# Patient Record
Sex: Male | Born: 1985 | Race: Black or African American | Hispanic: No | Marital: Married | Smoking: Never smoker
Health system: Southern US, Community
[De-identification: ages and names within clinical notes are randomized; demographics above are authoritative.]

## PROBLEM LIST (undated history)

## (undated) DIAGNOSIS — T7840XA Allergy, unspecified, initial encounter: Secondary | ICD-10-CM

## (undated) HISTORY — DX: Allergy, unspecified, initial encounter: T78.40XA

---
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
## 2006-02-01 ENCOUNTER — Ambulatory Visit: Payer: Self-pay | Admitting: Family Medicine

## 2006-10-02 ENCOUNTER — Ambulatory Visit: Payer: Self-pay | Admitting: Family Medicine

## 2006-10-02 DIAGNOSIS — J309 Allergic rhinitis, unspecified: Secondary | ICD-10-CM | POA: Insufficient documentation

## 2007-08-13 ENCOUNTER — Telehealth (INDEPENDENT_AMBULATORY_CARE_PROVIDER_SITE_OTHER): Payer: Self-pay | Admitting: *Deleted

## 2007-08-15 ENCOUNTER — Ambulatory Visit: Payer: Self-pay | Admitting: Family Medicine

## 2007-08-21 ENCOUNTER — Telehealth: Payer: Self-pay | Admitting: *Deleted

## 2007-08-22 ENCOUNTER — Ambulatory Visit: Payer: Self-pay | Admitting: Family Medicine

## 2007-08-22 DIAGNOSIS — L049 Acute lymphadenitis, unspecified: Secondary | ICD-10-CM

## 2007-12-17 ENCOUNTER — Telehealth: Payer: Self-pay | Admitting: *Deleted

## 2007-12-17 ENCOUNTER — Ambulatory Visit: Payer: Self-pay | Admitting: Family Medicine

## 2007-12-17 DIAGNOSIS — G43109 Migraine with aura, not intractable, without status migrainosus: Secondary | ICD-10-CM | POA: Insufficient documentation

## 2007-12-17 DIAGNOSIS — R42 Dizziness and giddiness: Secondary | ICD-10-CM | POA: Insufficient documentation

## 2010-04-13 ENCOUNTER — Ambulatory Visit (INDEPENDENT_AMBULATORY_CARE_PROVIDER_SITE_OTHER): Payer: BC Managed Care – PPO | Admitting: Family Medicine

## 2010-04-13 ENCOUNTER — Encounter: Payer: Self-pay | Admitting: *Deleted

## 2010-04-13 ENCOUNTER — Encounter: Payer: Self-pay | Admitting: Family Medicine

## 2010-04-13 VITALS — BP 124/77 | HR 61 | Temp 98.0°F | Ht 69.0 in | Wt 129.8 lb

## 2010-04-13 DIAGNOSIS — R0789 Other chest pain: Secondary | ICD-10-CM

## 2010-04-13 NOTE — Patient Instructions (Signed)
It was nice to meet you today.  Take Ibuprofen - 600 mg up to three times daily for pain and inflammation.  You may try Ranitidine for heartburn.

## 2010-04-14 ENCOUNTER — Encounter: Payer: Self-pay | Admitting: Family Medicine

## 2010-04-14 DIAGNOSIS — R0789 Other chest pain: Secondary | ICD-10-CM | POA: Insufficient documentation

## 2010-04-14 NOTE — Progress Notes (Signed)
  Subjective:    Patient ID: Earl Medina, male    DOB: Apr 22, 1985, 25 y.o.   MRN: [id number]  HPI 25 yo M presenting with 1 month Hx of daily chest tightness, associated with heart racing and shortness of breath. Pain was first noticed x 1 month ago - midsternum, lasts for hours, nothing makes better or worse, no radiation. Denies HA, dizziness, N/V/D/C, LE edema, rash, cough or other URI s/s, dyspesia, Hx of similar pain. He does endorse working doing upper body strength training prior to pain starting. No FamHx of heart disease or early death. Patient is a Archivist - endorses anxiety, especially during episode of chest pain. No ETOH, tobacco, or drug use.   Review of Systems See HPI.   Objective:   Physical Exam  Constitutional: Vital signs are normal. He appears well-developed and well-nourished.  Non-toxic appearance.  Neck: Normal range of motion. No thyromegaly present.  Cardiovascular: Normal rate, regular rhythm, S1 normal, S2 normal and normal pulses.   No murmur heard. Pulmonary/Chest: Effort normal and breath sounds normal.  Abdominal: Soft. Normal appearance and bowel sounds are normal. There is no tenderness.  Musculoskeletal: Normal range of motion.  Skin: Skin is warm and dry.  Psychiatric: He has a normal mood and affect. His speech is normal and behavior is normal. Judgment and thought content normal. Cognition and memory are normal.          Assessment & Plan:

## 2010-04-14 NOTE — Assessment & Plan Note (Signed)
25 yo M with atypical CP. Vitals and PE reassuring. No red flags today. DDx: Cardiac - unlikely. Normal exam and vitals. History also not c/w cardiac pain. Pulm - unlikely. Not tachycardic. Normal O2 sat. GI - possible. Discussed behavioral modification, regular meals, no spicy or acidic foods, may use OTC Zantac. MSK - No point tenderness on exam, but still possible as patient carries backpack around daily and also endorses intermittent low back pain associated with symptoms. Psych - possible. Discussed meditation/breathing exercises. Precautions given for reevaluation.

## 2010-10-17 ENCOUNTER — Other Ambulatory Visit: Payer: BC Managed Care – PPO

## 2010-10-17 ENCOUNTER — Ambulatory Visit (INDEPENDENT_AMBULATORY_CARE_PROVIDER_SITE_OTHER): Payer: BC Managed Care – PPO | Admitting: *Deleted

## 2010-10-17 DIAGNOSIS — Z23 Encounter for immunization: Secondary | ICD-10-CM

## 2010-10-17 DIAGNOSIS — Z111 Encounter for screening for respiratory tuberculosis: Secondary | ICD-10-CM

## 2010-10-17 DIAGNOSIS — Z0184 Encounter for antibody response examination: Secondary | ICD-10-CM

## 2010-10-17 MED ORDER — TETANUS-DIPHTH-ACELL PERTUSSIS 5-2.5-18.5 LF-MCG/0.5 IM SUSP: 0.5000 mL | Freq: Once | INTRAMUSCULAR | Status: DC

## 2010-10-17 NOTE — Progress Notes (Signed)
Varicella titer done today [facility] Warda Mcqueary

## 2010-10-19 ENCOUNTER — Ambulatory Visit (INDEPENDENT_AMBULATORY_CARE_PROVIDER_SITE_OTHER): Payer: BC Managed Care – PPO | Admitting: *Deleted

## 2010-10-19 DIAGNOSIS — IMO0001 Reserved for inherently not codable concepts without codable children: Secondary | ICD-10-CM

## 2010-10-19 DIAGNOSIS — Z111 Encounter for screening for respiratory tuberculosis: Secondary | ICD-10-CM

## 2010-10-19 LAB — TB SKIN TEST
Induration: 0
TB Skin Test: NEGATIVE mm

## 2010-10-19 NOTE — Progress Notes (Signed)
PPD negative. 0 mm Patient given copy of varicella titer.

## 2010-11-22 LAB — GLUCOSE, CAPILLARY: Glucose-Capillary: 76

## 2011-08-02 ENCOUNTER — Ambulatory Visit (INDEPENDENT_AMBULATORY_CARE_PROVIDER_SITE_OTHER): Payer: BC Managed Care – PPO | Admitting: Family Medicine

## 2011-08-02 ENCOUNTER — Ambulatory Visit: Payer: BC Managed Care – PPO

## 2011-08-02 VITALS — BP 115/70 | HR 67 | Temp 98.1°F | Resp 16 | Ht 68.25 in | Wt 134.0 lb

## 2011-08-02 DIAGNOSIS — M25522 Pain in left elbow: Secondary | ICD-10-CM

## 2011-08-02 DIAGNOSIS — M25529 Pain in unspecified elbow: Secondary | ICD-10-CM

## 2011-08-02 MED ORDER — MELOXICAM 15 MG PO TABS: 15.0000 mg | ORAL_TABLET | Freq: Every day | ORAL | Status: DC

## 2011-08-02 NOTE — Progress Notes (Signed)
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 He's had a week of intermittent left elbow pain which became much worse today. Feels like there is a deep ache in the left elbow although she's had no trauma. He has no increase in pain when he bends or pronates his forearm. He never had this before.  Family history: Negative for arthritis except his maternal grandmother.  Objective: Alert healthy young man in no distress Full range of motion of left elbow with no point tenderness, no bony abnormality, no ecchymosis or overlying skin rash. [facility] reading (PRIMARY) by  Dr. Milus Glazier Left elbow. Negative   Assessment:  Arthralgia with no obvious cause  Plan:  Meloxicam.  Recheck two weeks

## 2011-08-02 NOTE — Patient Instructions (Addendum)
Return in 2 weeks for recheck. 

## 2012-04-28 ENCOUNTER — Ambulatory Visit (INDEPENDENT_AMBULATORY_CARE_PROVIDER_SITE_OTHER): Payer: BC Managed Care – PPO | Admitting: Internal Medicine

## 2012-04-28 VITALS — BP 116/74 | HR 66 | Temp 97.7°F | Resp 16 | Ht 69.5 in | Wt 134.0 lb

## 2012-04-28 DIAGNOSIS — J019 Acute sinusitis, unspecified: Secondary | ICD-10-CM

## 2012-04-28 DIAGNOSIS — J301 Allergic rhinitis due to pollen: Secondary | ICD-10-CM

## 2012-04-28 MED ORDER — FLUTICASONE PROPIONATE 50 MCG/ACT NA SUSP: NASAL | Status: DC

## 2012-04-28 MED ORDER — AMOXICILLIN 875 MG PO TABS: 875.0000 mg | ORAL_TABLET | Freq: Two times a day (BID) | ORAL | Status: DC

## 2012-04-28 NOTE — Progress Notes (Signed)
  Subjective:    Patient ID: Earl Medina, male    DOB: 1985/04/18, 27 y.o.   MRN: [id number]  HPI  Pt complaining of pressure HA's and nasal congestion x 1 month No fever, cough, earache ST in the morning H/o allergies, some sneezing No new environmental changes Worse in the morning and at night, but periodically having problems throughout the day     Review of Systems Non contrib     Objective:   Physical Exam Tms clear Nares boggy thr clear Max tend to perc No nodes       Assessment & Plan:  Sinutis AR  Meds ordered this encounter  Medications  . amoxicillin (AMOXIL) 875 MG tablet    Sig: Take 1 tablet (875 mg total) by mouth 2 (two) times daily.    Dispense:  20 tablet    Refill:  0  . fluticasone (FLONASE) 50 MCG/ACT nasal spray    Sig: 1 spray each nostril twice a day    Dispense:  16 g    Refill:  6

## 2012-05-16 ENCOUNTER — Telehealth: Payer: Self-pay | Admitting: Radiology

## 2012-05-16 NOTE — Telephone Encounter (Signed)
Patient called he states he is having facial itching/ he thinks this may be from the Amox, he states this has improved today. Noticed yesterday, no trouble breathing or any other difficulty, no swelling , no throat symptoms no wheezing. Patient advised to go to ER if any of the above symptoms develop. He is advised Claritin or Zyrtec during the day and 50mg  of Benadryl at night until rash gone.

## 2012-07-19 ENCOUNTER — Ambulatory Visit (INDEPENDENT_AMBULATORY_CARE_PROVIDER_SITE_OTHER): Payer: BC Managed Care – PPO | Admitting: Physician Assistant

## 2012-07-19 VITALS — BP 114/75 | HR 63 | Temp 99.0°F | Resp 17 | Ht 69.0 in | Wt 130.0 lb

## 2012-07-19 DIAGNOSIS — J329 Chronic sinusitis, unspecified: Secondary | ICD-10-CM

## 2012-07-19 DIAGNOSIS — J309 Allergic rhinitis, unspecified: Secondary | ICD-10-CM

## 2012-07-19 MED ORDER — IPRATROPIUM BROMIDE 0.03 % NA SOLN: 2.0000 | Freq: Two times a day (BID) | NASAL | Status: DC

## 2012-07-19 MED ORDER — GUAIFENESIN ER 1200 MG PO TB12: 1.0000 | ORAL_TABLET | Freq: Two times a day (BID) | ORAL | Status: DC | PRN

## 2012-07-19 NOTE — Progress Notes (Signed)
  Subjective:    Patient ID: Earl Medina, male    DOB: September 02, 1985, 27 y.o.   MRN: [id number]  HPI   Earl Medina is a very pleasant 27 yr old male here with concern for illness.  States he had a "real bad cold" about 5 days ago.  This has now "tapered off" but he continues to have lots of nasal congestion at night.  Feels pressure in his head, esp around left eye.  States he has always had sinus problems.  Was treated for sinusitis in March (developed an allergy to amox at this time).  Denies fever with this episode but does endorse some facial pain.  Has been using alkaseltzer and sudafed for symptoms which are both helping.  He does endorse a history of allergy problems, but only uses meds prn.  Was started on Flonase with last sinus infection but has not been taking.     Review of Systems  Constitutional: Negative for fever and chills.  HENT: Positive for congestion, rhinorrhea and sinus pressure. Negative for hearing loss, ear pain, sore throat and neck pain.   Respiratory: Negative for cough, shortness of breath and wheezing.   Cardiovascular: Negative.   Gastrointestinal: Negative.   Musculoskeletal: Negative.   Skin: Negative.   Neurological: Positive for headaches.       Objective:   Physical Exam  Vitals reviewed. Constitutional: He is oriented to person, place, and time. He appears well-developed and well-nourished. No distress.  HENT:  Head: Normocephalic and atraumatic.  Right Ear: Tympanic membrane and ear canal normal.  Left Ear: Tympanic membrane and ear canal normal.  Nose: Mucosal edema and rhinorrhea present. Right sinus exhibits no maxillary sinus tenderness and no frontal sinus tenderness. Left sinus exhibits no maxillary sinus tenderness and no frontal sinus tenderness.  Mouth/Throat: Uvula is midline, oropharynx is clear and moist and mucous membranes are normal.  Eyes: Conjunctivae are normal. No scleral icterus.  Neck: Neck supple.  Cardiovascular: Normal  rate, regular rhythm and normal heart sounds.   Pulmonary/Chest: Effort normal and breath sounds normal. He has no wheezes. He has no rales.  Lymphadenopathy:    He has no cervical adenopathy.  Neurological: He is alert and oriented to person, place, and time.  Skin: Skin is warm and dry.  Psychiatric: He has a normal mood and affect. His behavior is normal.          Assessment & Plan:  Sinusitis - Plan: ipratropium (ATROVENT) 0.03 % nasal spray, Guaifenesin (MUCINEX MAXIMUM STRENGTH) 1200 MG TB12  Allergic rhinitis - Plan: ipratropium (ATROVENT) 0.03 % nasal spray, Guaifenesin (MUCINEX MAXIMUM STRENGTH) 1200 MG TB12   Earl Medina is a very pleasant 27 yr old male with allergic rhinitis and sinusitis.  With 5 days of symptoms and no sinus tenderness or fever, I do not think he warrants antibiotics at this time.  Will restart Flonase daily and add otc antihistamine.  Will also try Atrovent and Mucinex for symptomatic relief.  If pt is not improved in 3-4 days or if he develops fever or facial pain, he will call and we can send an antibiotic.  Given amox allergy, would consider doxy or levaquin.

## 2012-07-19 NOTE — Patient Instructions (Addendum)
Restart Flonase daily - this will help calm down the inflammation in your sinuses.  Continue using this throughout the allergy season to control symptoms.  I would also begin an allergy medicine (Zyrtec/Claritin/Allegra) by mouth.    Begin using Atrovent nasal spray twice daily to relieve congestion (this can be used with the Flonase, just separate the dosing by about 20-30 minutes)  Use Mucinex twice daily - this helps thin out mucus so your body can clear it more easily  If you are not improving in the next 2-3 days, OR if you develop fever >101.53F or facial pain that hurts to the touch please let me know and we will send an antibiotics  Drink plenty of fluids (water is best!) this will help your sinuses drain   Sinusitis Sinusitis is redness, soreness, and swelling (inflammation) of the paranasal sinuses. Paranasal sinuses are air pockets within the bones of your face (beneath the eyes, the middle of the forehead, or above the eyes). In healthy paranasal sinuses, mucus is able to drain out, and air is able to circulate through them by way of your nose. However, when your paranasal sinuses are inflamed, mucus and air can become trapped. This can allow bacteria and other germs to grow and cause infection. Sinusitis can develop quickly and last only a short time (acute) or continue over a long period (chronic). Sinusitis that lasts for more than 12 weeks is considered chronic.  CAUSES  Causes of sinusitis include:  Allergies.  Structural abnormalities, such as displacement of the cartilage that separates your nostrils (deviated septum), which can decrease the air flow through your nose and sinuses and affect sinus drainage.  Functional abnormalities, such as when the small hairs (cilia) that line your sinuses and help remove mucus do not work properly or are not present. SYMPTOMS  Symptoms of acute and chronic sinusitis are the same. The primary symptoms are pain and pressure around the  affected sinuses. Other symptoms include:  Upper toothache.  Earache.  Headache.  Bad breath.  Decreased sense of smell and taste.  A cough, which worsens when you are lying flat.  Fatigue.  Fever.  Thick drainage from your nose, which often is green and may contain pus (purulent).  Swelling and warmth over the affected sinuses. DIAGNOSIS  Your caregiver will perform a physical exam. During the exam, your caregiver may:  Look in your nose for signs of abnormal growths in your nostrils (nasal polyps).  Tap over the affected sinus to check for signs of infection.  View the inside of your sinuses (endoscopy) with a special imaging device with a light attached (endoscope), which is inserted into your sinuses. If your caregiver suspects that you have chronic sinusitis, one or more of the following tests may be recommended:  Allergy tests.  Nasal culture A sample of mucus is taken from your nose and sent to a lab and screened for bacteria.  Nasal cytology A sample of mucus is taken from your nose and examined by your caregiver to determine if your sinusitis is related to an allergy. TREATMENT  Most cases of acute sinusitis are related to a viral infection and will resolve on their own within 10 days. Sometimes medicines are prescribed to help relieve symptoms (pain medicine, decongestants, nasal steroid sprays, or saline sprays).  However, for sinusitis related to a bacterial infection, your caregiver will prescribe antibiotic medicines. These are medicines that will help kill the bacteria causing the infection.  Rarely, sinusitis is caused by a  fungal infection. In theses cases, your caregiver will prescribe antifungal medicine. For some cases of chronic sinusitis, surgery is needed. Generally, these are cases in which sinusitis recurs more than 3 times per year, despite other treatments. HOME CARE INSTRUCTIONS   Drink plenty of water. Water helps thin the mucus so your sinuses  can drain more easily.  Use a humidifier.  Inhale steam 3 to 4 times a day (for example, sit in the bathroom with the shower running).  Apply a warm, moist washcloth to your face 3 to 4 times a day, or as directed by your caregiver.  Use saline nasal sprays to help moisten and clean your sinuses.  Take over-the-counter or prescription medicines for pain, discomfort, or fever only as directed by your caregiver. SEEK IMMEDIATE MEDICAL CARE IF:  You have increasing pain or severe headaches.  You have nausea, vomiting, or drowsiness.  You have swelling around your face.  You have vision problems.  You have a stiff neck.  You have difficulty breathing. MAKE SURE YOU:   Understand these instructions.  Will watch your condition.  Will get help right away if you are not doing well or get worse. Document Released: 02/06/2005 Document Revised: 05/01/2011 Document Reviewed: 02/21/2011 [facility] Patient Information 2014 [place], [place].

## 2012-07-23 ENCOUNTER — Telehealth: Payer: Self-pay

## 2012-07-23 NOTE — Telephone Encounter (Signed)
PT STATES HE WAS TOLD WE WOULD CALL HIM IN AN ANTIBIOTIC IF NEEDED. PLEASE CALL [phone]    [facility] ON [address]

## 2012-07-23 NOTE — Telephone Encounter (Signed)
Please clarify with pt what he is experiencing.  Facial pain?  Fever?  Is he using the meds rx'd at last visit?  If so and not improved, ok to send levaquin 750mg  daily x 5 days for sinusitis.  He needs to continue daily Flonase as well.

## 2012-07-24 MED ORDER — LEVOFLOXACIN 750 MG PO TABS: 750.0000 mg | ORAL_TABLET | Freq: Every day | ORAL | Status: DC

## 2012-07-24 NOTE — Telephone Encounter (Signed)
Thanks. Left message for him to call back. I tried calling yesterday also did not get answer.

## 2012-07-24 NOTE — Telephone Encounter (Signed)
Spoke to him he has headaches left sided / eye and facial pressure. Levaquin sent in, he will continue flonase.

## 2012-11-10 IMAGING — CR DG ELBOW COMPLETE 3+V*L*
2 series · 2 of 2 positions shown · non-contrast
Comparison: None.

CLINICAL DATA: Pain.

LEFT ELBOW - COMPLETE 3+ VIEW

[AP]
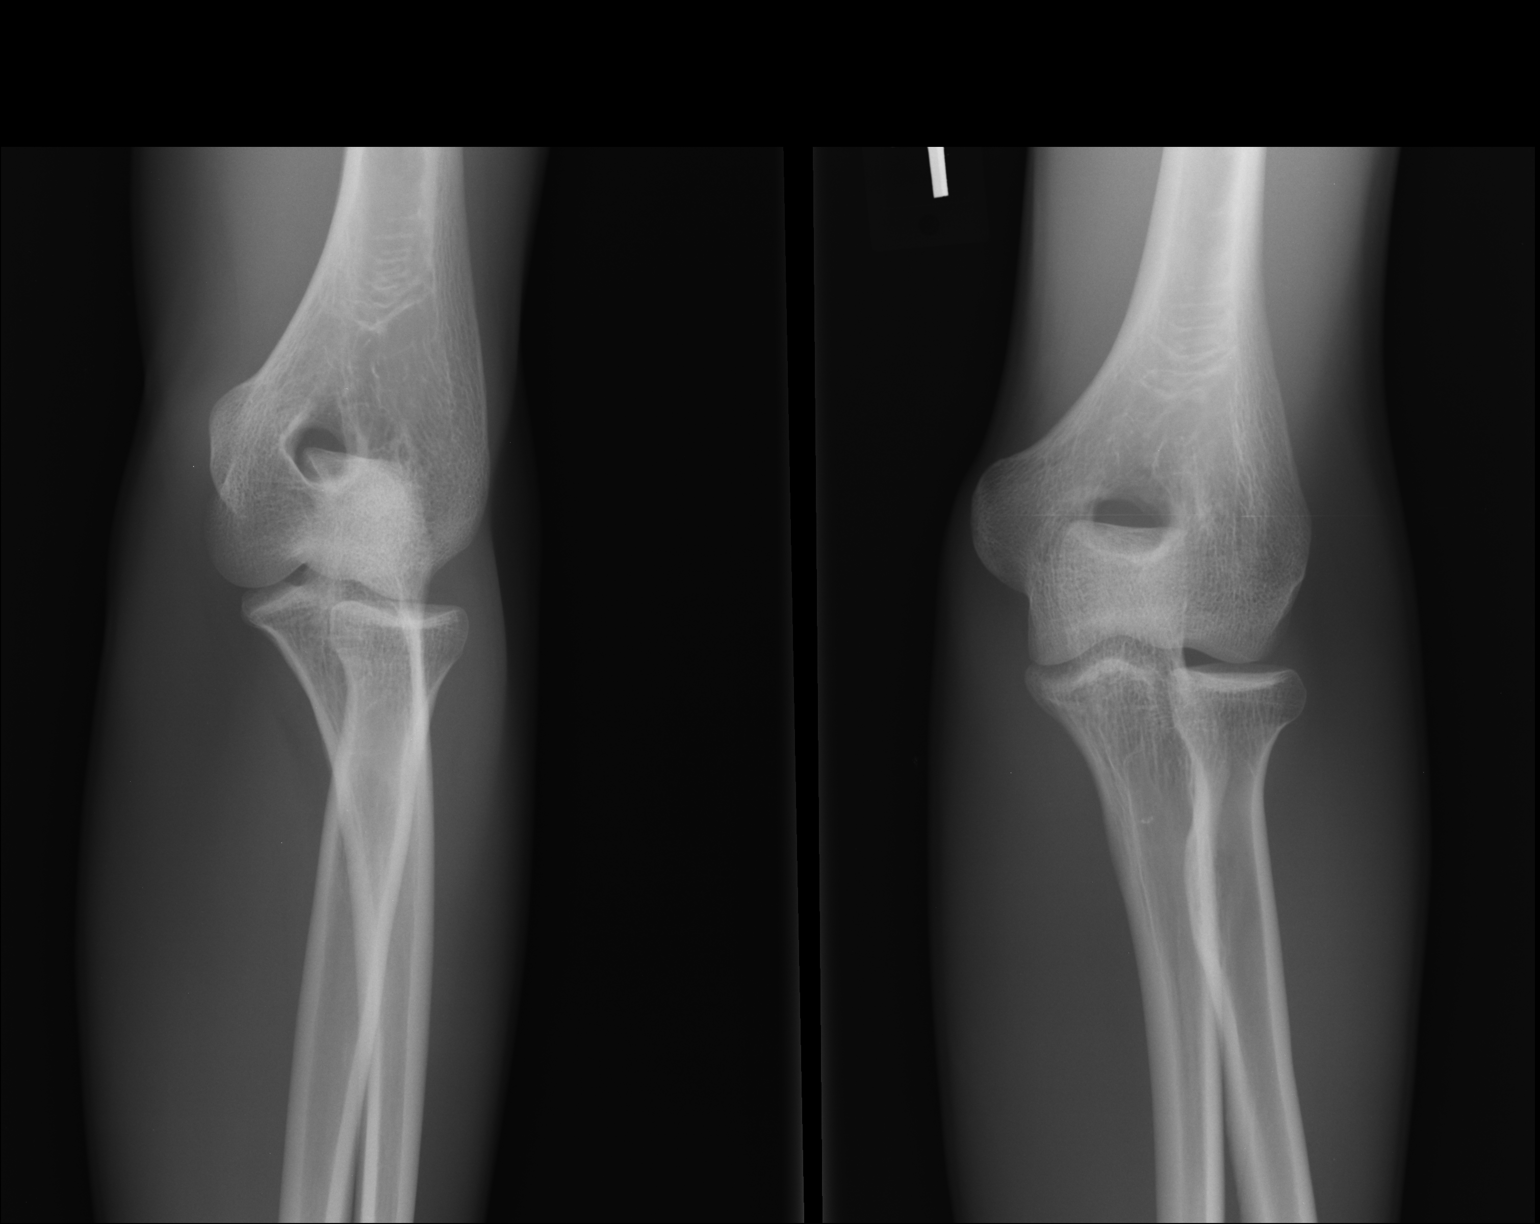

[lateral]
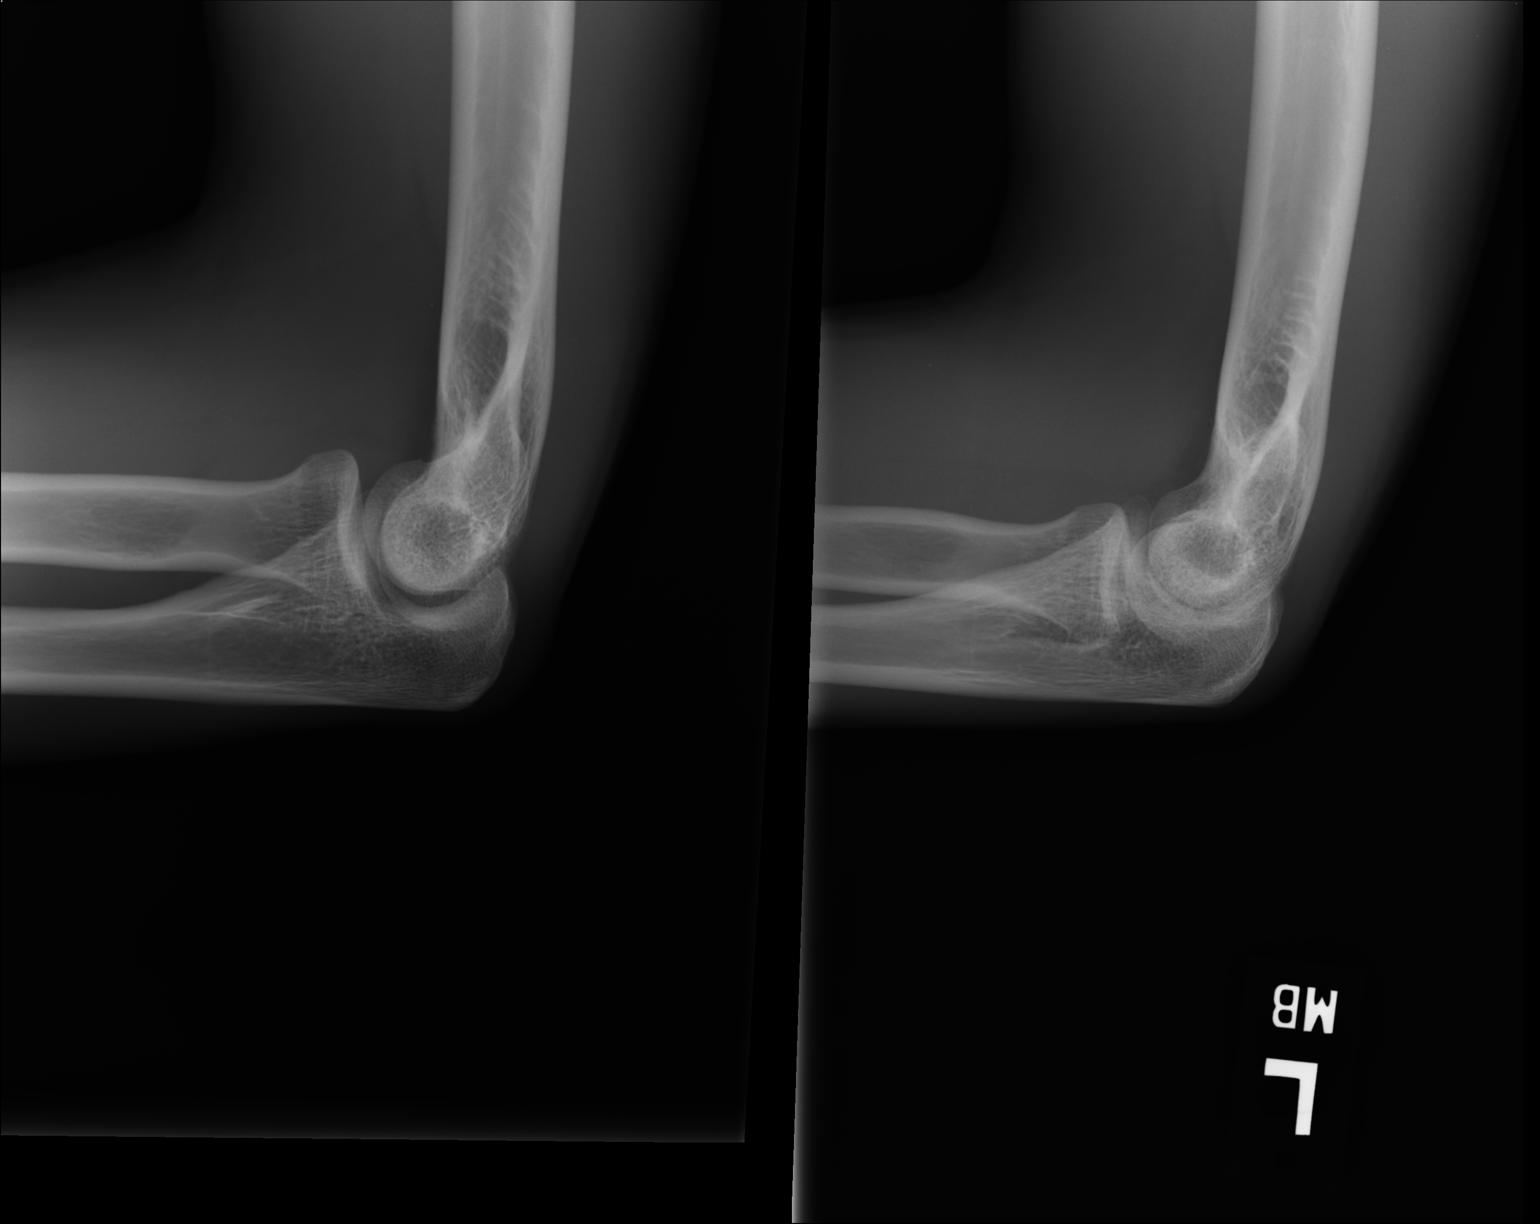

[2 of 2 positions shown; findings below may reference images not displayed]

FINDINGS: Imaged bones, joints and soft tissues appear normal.
IMPRESSION: Negative study.

Clinically significant discrepancy from primary report, if
provided: None

## 2014-06-24 ENCOUNTER — Ambulatory Visit (INDEPENDENT_AMBULATORY_CARE_PROVIDER_SITE_OTHER): Payer: Managed Care, Other (non HMO) | Admitting: Family Medicine

## 2014-06-24 VITALS — BP 118/74 | HR 71 | Temp 98.2°F | Ht 69.5 in | Wt 136.2 lb

## 2014-06-24 DIAGNOSIS — R6884 Jaw pain: Secondary | ICD-10-CM | POA: Diagnosis not present

## 2014-06-24 MED ORDER — TIZANIDINE HCL 2 MG PO TABS: 2.0000 mg | ORAL_TABLET | Freq: Three times a day (TID) | ORAL | Status: DC | PRN

## 2014-06-24 MED ORDER — IBUPROFEN 800 MG PO TABS: 800.0000 mg | ORAL_TABLET | Freq: Three times a day (TID) | ORAL | Status: DC | PRN

## 2014-06-24 NOTE — Progress Notes (Signed)
Chief Complaint:  Chief Complaint  Patient presents with  . Jaw Pain    left side x 2 days, stiff feeling    HPI: Earl Medina is a 29 y.o. male who is here for  TMJ  left side 6 days, worse in the last 2 days. he denies any fevers, chills, dysphasia, increase salivation He has had it since September, cracks and clicks. When he opens his mouth and it has been stiff the last 2 days. He has no history of any recent trauma, any puncture wounds. He is up-to-date on his tetanus. No lock jaw. 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 It's very expensive. He grinds his teeth at night, he has had x-rays at his dentist office.  He tried a mouth guard and mold yourself from [employer]. His has not worked very well.  6/10 pain for the last 6 days, denies numbness/tingling  Past Medical History  Diagnosis Date  . Allergy    History reviewed. No pertinent past surgical history. History   Social History  . Marital Status: Single    Spouse Name: N/A  . Number of Children: N/A  . Years of Education: N/A   Social History Main Topics  . Smoking status: Never Smoker   . Smokeless tobacco: Not on file  . Alcohol Use: No  . Drug Use: No  . Sexual Activity: Yes   Other Topics Concern  . None   Social History Narrative   History reviewed. No pertinent family history. Allergies  Allergen Reactions  . Amoxicillin Rash   Prior to Admission medications   Medication Sig Start Date End Date Taking? Authorizing Provider  Guaifenesin (MUCINEX MAXIMUM STRENGTH) 1200 MG TB12 Take 1 tablet (1,200 mg total) by mouth every 12 (twelve) hours as needed. Patient not taking: Reported on 06/24/2014 07/19/12   Godfrey PickEleanore E Egan, PA-C  ipratropium (ATROVENT) 0.03 % nasal spray Place 2 sprays into the nose 2 (two) times daily. Patient not taking: Reported on 06/24/2014 07/19/12   Godfrey PickEleanore E Egan, PA-C  levofloxacin (LEVAQUIN) 750 MG  tablet Take 1 tablet (750 mg total) by mouth daily. Patient not taking: Reported on 06/24/2014 07/24/12   Godfrey PickEleanore E Egan, PA-C     ROS: The patient denies fevers, chills, night sweats, unintentional weight loss, chest pain, palpitations, wheezing, dyspnea on exertion, nausea, vomiting, abdominal pain, dysuria, hematuria, melena, numbness, weakness, or tingling.   All other systems have been reviewed and were otherwise negative with the exception of those mentioned in the HPI and as above.    PHYSICAL EXAM: Filed Vitals:   06/24/14 1634  BP: 118/74  Pulse: 71  Temp: 98.2 F (36.8 C)   Filed Vitals:   06/24/14 1634  Height: 5' 9.5" (1.765 m)  Weight: 136 lb 3.2 oz (61.78 kg)   Body mass index is 19.83 kg/(m^2).  General: Alert, no acute distress HEENT:  Normocephalic, atraumatic, oropharynx patent. EOMI, PERRLA Cardiovascular:  Regular rate and rhythm, no rubs murmurs or gallops.  No Carotid bruits, radial pulse intact. No pedal edema.  Respiratory: Clear to auscultation bilaterally.  No wheezes, rales, or rhonchi.  No cyanosis, no use of accessory musculature GI: No organomegaly, abdomen is soft and non-tender, positive bowel sounds.  No masses. Skin: No rashes. Neurologic: Facial musculature symmetric. Psychiatric: Patient is appropriate throughout our interaction. Lymphatic: No cervical lymphadenopathy Musculoskeletal: Gait intact. No appreciable TMJ deformity,  crepitus, full range of motion of the jaw in all directions without any pain. He has she has pain along the little bit lower then at that point of the TMJ. His masseter muscle may be slightly larger on the left than the right but not by much.   LABS: Results for orders placed or performed in visit on 10/17/10  TB Skin Test  Result Value Ref Range   TB Skin Test Negative mm   Induration 0 mm      EKG/XRAY:   Primary [facility].   ASSESSMENT/PLAN: Encounter Diagnosis  Name Primary?  .  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 We talked about narcotic or controlled substance medications and I don't think that he needs it at this point and after talking to him about the side effects he does not want it either. He can try another mouthguard that's over-the-counter to see if it's any different. [place] controlled substance profile was pulled and he did not have any illegal activities. Follow up as needed   Gross sideeffects, risk and benefits, and alternatives of medications d/w patient. Patient is aware that all medications have potential sideeffects and we are unable to predict every sideeffect or drug-drug interaction that may occur.  Hamilton CapriLE, Arfa Lamarca PHUONG, DO 06/24/2014 5:24 PM

## 2014-10-10 ENCOUNTER — Ambulatory Visit (INDEPENDENT_AMBULATORY_CARE_PROVIDER_SITE_OTHER): Payer: Managed Care, Other (non HMO) | Admitting: Physician Assistant

## 2014-10-10 VITALS — BP 116/72 | HR 63 | Temp 98.5°F | Resp 14 | Ht 69.5 in | Wt 137.8 lb

## 2014-10-10 DIAGNOSIS — Z0000 Encounter for general adult medical examination without abnormal findings: Secondary | ICD-10-CM

## 2014-10-10 DIAGNOSIS — Z139 Encounter for screening, unspecified: Secondary | ICD-10-CM | POA: Diagnosis not present

## 2014-10-10 LAB — CBC WITH DIFFERENTIAL/PLATELET
BASOS PCT: 1 % (ref 0–1)
Basophils Absolute: 0 10*3/uL (ref 0.0–0.1)
EOS PCT: 4 % (ref 0–5)
Eosinophils Absolute: 0.2 10*3/uL (ref 0.0–0.7)
HEMATOCRIT: 48.2 % (ref 39.0–52.0)
Hemoglobin: 16.9 g/dL (ref 13.0–17.0)
LYMPHS PCT: 32 % (ref 12–46)
Lymphs Abs: 1.3 10*3/uL (ref 0.7–4.0)
MCH: 29 pg (ref 26.0–34.0)
MCHC: 35.1 g/dL (ref 30.0–36.0)
MCV: 82.8 fL (ref 78.0–100.0)
MONO ABS: 0.3 10*3/uL (ref 0.1–1.0)
MONOS PCT: 8 % (ref 3–12)
MPV: 8.5 fL — ABNORMAL LOW (ref 8.6–12.4)
Neutro Abs: 2.2 10*3/uL (ref 1.7–7.7)
Neutrophils Relative %: 55 % (ref 43–77)
Platelets: 310 10*3/uL (ref 150–400)
RBC: 5.82 MIL/uL — AB (ref 4.22–5.81)
RDW: 13.8 % (ref 11.5–15.5)
WBC: 4 10*3/uL (ref 4.0–10.5)

## 2014-10-10 LAB — LIPID PANEL
CHOL/HDL RATIO: 2.2 ratio (ref <=5.0)
Cholesterol: 131 mg/dL (ref 125–200)
HDL: 60 mg/dL (ref >=40)
LDL CALC: 60 mg/dL (ref <130)
TRIGLYCERIDES: 56 mg/dL (ref <150)
VLDL: 11 mg/dL (ref <30)

## 2014-10-10 LAB — COMPLETE METABOLIC PANEL WITH GFR
ALT: 32 U/L (ref 9–46)
AST: 19 U/L (ref 10–40)
Albumin: 4.6 g/dL (ref 3.6–5.1)
Alkaline Phosphatase: 87 U/L (ref 40–115)
BUN: 10 mg/dL (ref 7–25)
CO2: 29 mmol/L (ref 20–31)
Calcium: 10.1 mg/dL (ref 8.6–10.3)
Chloride: 102 mmol/L (ref 98–110)
Creat: 1.09 mg/dL (ref 0.60–1.35)
GFR, Est African American: >89 mL/min (ref >=60)
GLUCOSE: 101 mg/dL — AB (ref 65–99)
POTASSIUM: 4.4 mmol/L (ref 3.5–5.3)
SODIUM: 142 mmol/L (ref 135–146)
Total Bilirubin: 0.7 mg/dL (ref 0.2–1.2)
Total Protein: 7 g/dL (ref 6.1–8.1)

## 2014-10-10 LAB — HIV ANTIBODY (ROUTINE TESTING W REFLEX): HIV 1&2 Ab, 4th Generation: NONREACTIVE

## 2014-10-10 LAB — RPR

## 2014-10-10 NOTE — Progress Notes (Signed)
   10/10/2014 at 9:24 AM  [name] / DOB: [birth year] / MRN: [id number]  The patient has Atypical chest pain on his problem list.  SUBJECTIVE  Earl Medina is a 29 y.o. 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 He has a form with him and would like this completed. He feels well today and has no complaints. Denies anhedonia and depression.   He  has a past medical history of Allergy.    Medications reviewed and updated by myself where necessary, and exist elsewhere in the encounter.   [name] is allergic to penicillins and amoxicillin. He  reports that he has never smoked. He does not have any smokeless tobacco history on file. He reports that he does not drink alcohol or use illicit drugs. He  reports that he currently engages in sexual activity. The patient  has no past surgical history on file.  His family history is not on file.  Review of Systems  Constitutional: Negative for fever and chills.  Respiratory: Negative for shortness of breath.   Cardiovascular: Negative for chest pain.  Gastrointestinal: Negative for nausea and abdominal pain.  Genitourinary: Negative.   Skin: Negative for rash.  Neurological: Negative for dizziness and headaches.   Immunization History  Administered Date(s) Administered  . PPD Test 10/17/2010  . Tdap 10/17/2010   Depression screen PHQ 2/9 10/10/2014  Decreased Interest 0  Down, Depressed, Hopeless 0  PHQ - 2 Score 0    OBJECTIVE  His  height is 5' 9.5" (1.765 m) and weight is 137 lb 12.8 oz (62.506 kg). His oral temperature is 98.5 F (36.9 C). His blood pressure is 116/72 and his pulse is 63. His respiration is 14 and oxygen saturation is 99%.  The patient's body mass index is 20.06 kg/(m^2).  Physical Exam  Vitals reviewed. Constitutional: He is oriented to person, place, and time. He appears well-developed. No distress.  Eyes: EOM are normal. Pupils are equal,  round, and reactive to light. No scleral icterus.  Neck: Normal range of motion.  Cardiovascular: Normal rate and regular rhythm.   Respiratory: Effort normal and breath sounds normal.  GI: Soft. Bowel sounds are normal. He exhibits no distension.  Musculoskeletal: Normal range of motion.  Neurological: He is alert and oriented to person, place, and time. No cranial nerve deficit.  Skin: Skin is warm and dry. No rash noted. He is not diaphoretic.  Psychiatric: He has a normal mood and affect.     No results found for this or any previous visit (from the past 24 hour(s)).  ASSESSMENT & PLAN  Hamilton was seen today for annual exam and wellness screening.  Diagnoses and all orders for this visit:  Physical exam: Form completed and signed.    Screening -     COMPLETE METABOLIC PANEL WITH GFR -     Lipid panel -     CBC with Differential/Platelet       -     HIV with reflex and RPR.   The patient was advised to call or come back to clinic if he does not see an improvement in symptoms, or worsens with the above plan.   Deliah Boston, MHS, PA-C [facility] 10/10/2014 9:24 AM

## 2016-10-12 ENCOUNTER — Encounter: Payer: Self-pay | Admitting: Family Medicine

## 2016-10-12 ENCOUNTER — Ambulatory Visit (INDEPENDENT_AMBULATORY_CARE_PROVIDER_SITE_OTHER): Payer: Managed Care, Other (non HMO) | Admitting: Family Medicine

## 2016-10-12 VITALS — BP 131/81 | HR 68 | Temp 99.2°F | Resp 16 | Ht 69.5 in | Wt 144.4 lb

## 2016-10-12 DIAGNOSIS — J069 Acute upper respiratory infection, unspecified: Secondary | ICD-10-CM

## 2016-10-12 DIAGNOSIS — J029 Acute pharyngitis, unspecified: Secondary | ICD-10-CM | POA: Diagnosis not present

## 2016-10-12 LAB — POCT RAPID STREP A (OFFICE): Rapid Strep A Screen: NEGATIVE

## 2016-10-12 NOTE — Progress Notes (Signed)
   8/23/20189:52 AM  Earl Medina 1985-04-19, 31 y.o. male [id number]  Chief Complaint  Patient presents with  . Sore Throat    onset: Sunday evening, bodyaches, felt warm but no temps taken, a little cough/runny nose.  Tried advil cold/sinu  s and theraflu helped some.flu shot offered pt declines     HPI:   Patient is a 31 y.o. male who presents today for concerns of 5 days of sore throat, body aches, feverish, cough, mild runny nose, slightly getting better, worried about strep. OTC cold meds helping. Last dose last night. No sick contacts, non smoker, no asthma.  Depression screen [facility] 2/9 10/12/2016 10/10/2014  Decreased Interest 0 0  Down, Depressed, Hopeless 0 0  PHQ - 2 Score 0 0    Allergies  Allergen Reactions  . Penicillins   . Amoxicillin Rash    No current outpatient prescriptions on file prior to visit.   No current facility-administered medications on file prior to visit.     Past Medical History:  Diagnosis Date  . Allergy     History reviewed. No pertinent surgical history.  Social History  Substance Use Topics  . Smoking status: Never Smoker  . Smokeless tobacco: Never Used  . Alcohol use No    History reviewed. No pertinent family history.  Review of Systems  Constitutional: Positive for malaise/fatigue. Negative for chills and fever.  HENT: Positive for congestion and sore throat. Negative for ear discharge, ear pain and sinus pain.   Eyes: Positive for redness.  Respiratory: Positive for cough. Negative for sputum production and shortness of breath.   Cardiovascular: Negative for chest pain and palpitations.  Gastrointestinal: Negative for abdominal pain, diarrhea, nausea and vomiting.  Skin: Negative for rash.     OBJECTIVE:  Blood pressure 131/81, pulse 68, temperature 99.2 F (37.3 C), temperature source Oral, resp. rate 16, height 5' 9.5" (1.765 m), weight 144 lb 6.4 oz (65.5 kg), SpO2 97 %.  Physical Exam  Constitutional: He  is oriented to person, place, and time and well-developed, well-nourished, and in no distress.  HENT:  Head: Normocephalic and atraumatic.  Right Ear: Hearing, tympanic membrane, external ear and ear canal normal.  Left Ear: Hearing, tympanic membrane, external ear and ear canal normal.  Nose: Mucosal edema and rhinorrhea present.  Mouth/Throat: Posterior oropharyngeal edema and posterior oropharyngeal erythema present. No oropharyngeal exudate.    Eyes: Pupils are equal, round, and reactive to light. Conjunctivae and EOM are normal.  Neck: Neck supple.  Cardiovascular: Normal rate, regular rhythm and normal heart sounds.  Exam reveals no gallop and no friction rub.   No murmur heard. Pulmonary/Chest: Effort normal and breath sounds normal. He has no wheezes. He has no rales.  Lymphadenopathy:    He has no cervical adenopathy.  Neurological: He is alert and oriented to person, place, and time. Gait normal.  Skin: Skin is warm and dry.   .labs ASSESSMENT and PLAN:  1. URI, acute Discussed supportive measures for URI: increase hydration, rest, OTC medications, etc. RTC precautions discussed.  2. Acute pharyngitis, unspecified etiology - POCT rapid strep A - negative       Myles Lipps, MD Primary Care at [facility] [address] [place], [place] [address] Ph.  [phone] Fax [phone]

## 2016-10-12 NOTE — Patient Instructions (Addendum)
   IF you received an x-ray today, you will receive an invoice from [facility]. Please contact [facility] at [phone] with questions or concerns regarding your invoice.   IF you received labwork today, you will receive an invoice from [facility]. Please contact [facility] at [phone] with questions or concerns regarding your invoice.   Our billing staff will not be able to assist you with questions regarding bills from these companies.  You will be contacted with the lab results as soon as they are available. The fastest way to get your results is to activate your My Chart account. Instructions are located on the last page of this paperwork. If you have not heard from us regarding the results in 2 weeks, please contact this office.      Upper Respiratory Infection, Adult Most upper respiratory infections (URIs) are a viral infection of the air passages leading to the lungs. A URI affects the nose, throat, and upper air passages. The most common type of URI is nasopharyngitis and is typically referred to as "the common cold." URIs run their course and usually go away on their own. Most of the time, a URI does not require medical attention, but sometimes a bacterial infection in the upper airways can follow a viral infection. This is called a secondary infection. Sinus and middle ear infections are common types of secondary upper respiratory infections. Bacterial pneumonia can also complicate a URI. A URI can worsen asthma and chronic obstructive pulmonary disease (COPD). Sometimes, these complications can require emergency medical care and may be life threatening. What are the causes? Almost all URIs are caused by viruses. A virus is a type of germ and can spread from one person to another. What increases the risk? You may be at risk for a URI if:  You smoke.  You have chronic heart or lung disease.  You have a weakened defense (immune) system.  You are very  young or very old.  You have nasal allergies or asthma.  You work in crowded or poorly ventilated areas.  You work in health care facilities or schools. What are the signs or symptoms? Symptoms typically develop 2-3 days after you come in contact with a cold virus. Most viral URIs last 7-10 days. However, viral URIs from the influenza virus (flu virus) can last 14-18 days and are typically more severe. Symptoms may include:  Runny or stuffy (congested) nose.  Sneezing.  Cough.  Sore throat.  Headache.  Fatigue.  Fever.  Loss of appetite.  Pain in your forehead, behind your eyes, and over your cheekbones (sinus pain).  Muscle aches. How is this diagnosed? Your health care provider may diagnose a URI by:  Physical exam.  Tests to check that your symptoms are not due to another condition such as:  Strep throat.  Sinusitis.  Pneumonia.  Asthma. How is this treated? A URI goes away on its own with time. It cannot be cured with medicines, but medicines may be prescribed or recommended to relieve symptoms. Medicines may help:  Reduce your fever.  Reduce your cough.  Relieve nasal congestion. Follow these instructions at home:  Take medicines only as directed by your health care provider.  Gargle warm saltwater or take cough drops to comfort your throat as directed by your health care provider.  Use a warm mist humidifier or inhale steam from a shower to increase air moisture. This may make it easier to breathe.  Drink enough fluid to keep your urine clear   or pale yellow.  Eat soups and other clear broths and maintain good nutrition.  Rest as needed.  Return to work when your temperature has returned to normal or as your health care provider advises. You may need to stay home longer to avoid infecting others. You can also use a face mask and careful hand washing to prevent spread of the virus.  Increase the usage of your inhaler if you have asthma.  Do not  use any tobacco products, including cigarettes, chewing tobacco, or electronic cigarettes. If you need help quitting, ask your health care provider. How is this prevented? The best way to protect yourself from getting a cold is to practice good hygiene.  Avoid oral or hand contact with people with cold symptoms.  Wash your hands often if contact occurs. There is no clear evidence that vitamin C, vitamin E, echinacea, or exercise reduces the chance of developing a cold. However, it is always recommended to get plenty of rest, exercise, and practice good nutrition. Contact a health care provider if:  You are getting worse rather than better.  Your symptoms are not controlled by medicine.  You have chills.  You have worsening shortness of breath.  You have brown or red mucus.  You have yellow or brown nasal discharge.  You have pain in your face, especially when you bend forward.  You have a fever.  You have swollen neck glands.  You have pain while swallowing.  You have white areas in the back of your throat. Get help right away if:  You have severe or persistent:  Headache.  Ear pain.  Sinus pain.  Chest pain.  You have chronic lung disease and any of the following:  Wheezing.  Prolonged cough.  Coughing up blood.  A change in your usual mucus.  You have a stiff neck.  You have changes in your:  Vision.  Hearing.  Thinking.  Mood. This information is not intended to replace advice given to you by your health care provider. Make sure you discuss any questions you have with your health care provider. Document Released: 08/02/2000 Document Revised: 10/10/2015 Document Reviewed: 05/14/2013 Elsevier Interactive Patient Education  2017 [employer].  

## 2017-11-19 ENCOUNTER — Ambulatory Visit: Payer: Managed Care, Other (non HMO)

## 2020-05-21 ENCOUNTER — Ambulatory Visit (HOSPITAL_COMMUNITY)
Admission: EM | Admit: 2020-05-21 | Discharge: 2020-05-21 | Disposition: A | Discharge status: Home / Self Care | Payer: PRIVATE HEALTH INSURANCE

## 2020-05-21 ENCOUNTER — Other Ambulatory Visit: Payer: Self-pay

## 2020-05-21 ENCOUNTER — Ambulatory Visit (INDEPENDENT_AMBULATORY_CARE_PROVIDER_SITE_OTHER): Payer: PRIVATE HEALTH INSURANCE

## 2020-05-21 ENCOUNTER — Encounter (HOSPITAL_COMMUNITY): Payer: Self-pay

## 2020-05-21 DIAGNOSIS — M84374A Stress fracture, right foot, initial encounter for fracture: Secondary | ICD-10-CM

## 2020-05-21 DIAGNOSIS — M79671 Pain in right foot: Secondary | ICD-10-CM

## 2020-05-21 NOTE — ED Provider Notes (Signed)
[facility]    CSN: [id number] Arrival date & time: 05/21/20  1549      History   Chief Complaint Chief Complaint  Patient presents with  . foot/toe pain    HPI Earl Medina is a 35 y.o. male.   Patient here for evaluation of the right foot pain.  Reports pain has been ongoing for approximately 1 month after wearing a new pair of shoes.  Reports pain to top of right foot that radiates down into second and third toes.  Pain worse with movement and improves with rest.  Has taken ibuprofen with minimal relief.  Denies any numbness or tingling.  Able to bear weight.  Denies any fevers, chest pain, shortness of breath, N/V/D, numbness, tingling, weakness, abdominal pain, or headaches.   ROS: As per HPI, all other pertinent ROS negative   The history is provided by the patient.    Past Medical History:  Diagnosis Date  . Allergy     Patient Active Problem List   Diagnosis Date Noted  . Atypical chest pain 04/14/2010    History reviewed. No pertinent surgical history.     Home Medications    Prior to Admission medications   Medication Sig Start Date End Date Taking? Authorizing Provider  cetirizine (ZYRTEC) 10 MG tablet Take 1 tablet by mouth daily.    [provider]  fluticasone (FLONASE SENSIMIST) 27.5 MCG/SPRAY nasal spray 2 sprays each nostril    [provider]    Family History History reviewed. No pertinent family history.  Social History Social History   Tobacco Use  . Smoking status: Never Smoker  . Smokeless tobacco: Never Used  Substance Use Topics  . Alcohol use: No  . Drug use: No     Allergies   Penicillins and Amoxicillin   Review of Systems Review of Systems  Musculoskeletal: Positive for arthralgias and myalgias.     Physical Exam Triage Vital Signs ED Triage Vitals  Enc Vitals Group     BP 05/21/20 1631 128/74     Pulse Rate 05/21/20 1631 71     Resp 05/21/20 1631 18     Temp 05/21/20 1631  99.5 F (37.5 C)     Temp src --      SpO2 05/21/20 1631 97 %     Weight --      Height --      Head Circumference --      Peak Flow --      Pain Score 05/21/20 1630 4     Pain Loc --      Pain Edu? --      Excl. in GC? --    No data found.  Updated Vital Signs BP 128/74   Pulse 71   Temp 99.5 F (37.5 C)   Resp 18   SpO2 97%   Visual Acuity Right Eye Distance:   Left Eye Distance:   Bilateral Distance:    Right Eye Near:   Left Eye Near:    Bilateral Near:     Physical Exam Vitals and nursing note reviewed.  Constitutional:      General: He is not in acute distress.    Appearance: Normal appearance. He is not ill-appearing, toxic-appearing or diaphoretic.  HENT:     Head: Normocephalic and atraumatic.  Eyes:     Conjunctiva/sclera: Conjunctivae normal.  Cardiovascular:     Rate and Rhythm: Normal rate.     Pulses: Normal pulses.  Pulmonary:  Effort: Pulmonary effort is normal.  Abdominal:     General: Abdomen is flat.  Musculoskeletal:        General: Normal range of motion.     Cervical back: Normal range of motion.     Right foot: Normal range of motion and normal capillary refill. Tenderness and bony tenderness (top of right foot with tenderness and pain that radiates to second and third toes) present. No swelling or deformity. Normal pulse.  Feet:     Left foot:     Skin integrity: Skin integrity normal.     Toenail Condition: Left toenails are normal.  Skin:    General: Skin is warm and dry.  Neurological:     General: No focal deficit present.     Mental Status: He is alert and oriented to person, place, and time.  Psychiatric:        Mood and Affect: Mood normal.      UC Treatments / Results  Labs (all labs ordered are listed, but only abnormal results are displayed) Labs Reviewed - No data to display  EKG   Radiology DG Foot Complete Right  Result Date: 05/21/2020 CLINICAL DATA:  Foot pain for 1 month, no known injury EXAM:  RIGHT FOOT COMPLETE - 3+ VIEW COMPARISON:  None. FINDINGS: Periosteal thickening and elevation along the medial aspect of the second metatarsal with thin adjacent transverse lucency suspicious for a stress fracture type injury. No other acute fracture or significant malalignment significant within the limitations of a nonweightbearing exam. Normal bone mineralization. No worrisome osseous lesions. 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 IMPRESSION: Periosteal thickening and elevation along the medial aspect of the second metatarsal with thin adjacent transverse lucency suspicious for a stress fracture. Electronically Signed   By: Kreg Shropshire M.D.   On: 05/21/2020 17:27    Procedures Procedures (including critical care time)  Medications Ordered in UC Medications - No data to display  Initial Impression / Assessment and Plan / UC Course  I have reviewed the triage vital signs and the nursing notes.  Pertinent labs & imaging results that were available during my care of the patient were reviewed by me and considered in my medical decision making (see chart for details).     Stress fracture of right foot Right Foot Pain X-ray suspicious for stress fracture of the second metatarsal.  Postop shoe applied in the office.  Follow-up with Ortho in the next few days. Can take Tylenol and/or ibuprofen for pain and swelling.  Utilize RICE method for comfort Follow-up with PCP as needed  Final Clinical Impressions(s) / UC Diagnoses   Final diagnoses:  Stress fracture of right foot, initial encounter  Right foot pain     Discharge Instructions     Wear the post op shoe and follow up with Ortho in the next few days.   You can take Ibuprofen and/or Tylenol for pain and swelling. Use the RICE method:  RICE: Rest Ice for 10-15 minutes every 4-6 hours as needed for pain and swelling Compression (post-op shoe) Elevate above the hip  Return or go to the Emergency Department if  symptoms worsen or do not improve in the next few days.       ED Prescriptions    None     PDMP not reviewed this encounter.   Ivette Loyal, NP 05/21/20 [phone]

## 2020-05-21 NOTE — ED Triage Notes (Signed)
Pt in with c/o right foot and 3rd & 4th toe pain that he noticed about 1 month ago after he began wearing a new pair of shoes  Pt has taken advil with some relief  Denies any swelling

## 2020-05-21 NOTE — Discharge Instructions (Signed)
Wear the post op shoe and follow up with Ortho in the next few days.   You can take Ibuprofen and/or Tylenol for pain and swelling. Use the RICE method:  RICE: Rest Ice for 10-15 minutes every 4-6 hours as needed for pain and swelling Compression (post-op shoe) Elevate above the hip  Return or go to the Emergency Department if symptoms worsen or do not improve in the next few days.

## 2020-05-25 ENCOUNTER — Ambulatory Visit (INDEPENDENT_AMBULATORY_CARE_PROVIDER_SITE_OTHER): Payer: PRIVATE HEALTH INSURANCE | Admitting: Family

## 2020-05-25 DIAGNOSIS — M84374A Stress fracture, right foot, initial encounter for fracture: Secondary | ICD-10-CM | POA: Diagnosis not present

## 2020-05-25 NOTE — Progress Notes (Signed)
   Office Visit Note   Patient: Earl Medina           Date of Birth: 1986/02/16           MRN: [id number] Visit Date: 05/25/2020              Requested by: Ileana Ladd, MD [address] [place],  [place] [address] PCP: Ileana Ladd, MD  Chief Complaint  Patient presents with  . Right Foot - Pain    F/u ER 05/21/20      HPI: The patient is a 35 year old gentleman seen for initial evaluation of right foot pain. No injury associated with the pain. About 4 weeks ago did get some new shoes and states did work one over night shift where he was standing the entire shift around the time the pain started.   Has pain over dorsum of this foot that radiates to his toes. Was seen in urgent care, radiographs showed possible 2nd MT stress fracture. Is in post op shoe. Trying to minimize his weightbearing.  He is using Tylenol for pain.  Assessment & Plan: Visit Diagnoses:  1. Stress fracture of metatarsal bone of right foot, initial encounter     Plan: placed in short CAM to immobilize further. Minimize weight bearing for next 2 weeks.  May advance to full weightbearing in the cam in 2 weeks  Follow-Up Instructions: Return in about 4 weeks (around 06/22/2020).   Ortho Exam  Patient is alert, oriented, no adenopathy, well-dressed, normal affect, normal respiratory effort. On examination of the right foot tenderness along the second metatarsal.  Palpable dorsalis pedis pulse.  There is no edema no erythema.  Imaging: No results found. No images are attached to the encounter.  Labs: No results found for: HGBA1C, ESRSEDRATE, CRP, LABURIC, REPTSTATUS, GRAMSTAIN, CULT, LABORGA   Lab Results  Component Value Date   ALBUMIN 4.6 10/10/2014    No results found for: MG No results found for: VD25OH  No results found for: PREALBUMIN CBC EXTENDED Latest Ref Rng & Units 10/10/2014  WBC 4.0 - 10.5 K/uL 4.0  RBC 4.22 - 5.81 MIL/uL 5.82(H)  HGB 13.0 - 17.0 g/dL 65.6  HCT 81.2 - 75.1  % 48.2  PLT 150 - 400 K/uL 310  NEUTROABS 1.7 - 7.7 K/uL 2.2  LYMPHSABS 0.7 - 4.0 K/uL 1.3     There is no height or weight on file to calculate BMI.  Orders:  No orders of the defined types were placed in this encounter.  No orders of the defined types were placed in this encounter.    Procedures: No procedures performed  Clinical Data: No additional findings.  ROS:  All other systems negative, except as noted in the HPI. Review of Systems  Objective: Vital Signs: There were no vitals taken for this visit.  Specialty Comments:  No specialty comments available.  PMFS History: Patient Active Problem List   Diagnosis Date Noted  . Atypical chest pain 04/14/2010   Past Medical History:  Diagnosis Date  . Allergy     No family history on file.  No past surgical history on file. Social History   Occupational History  . Not on file  Tobacco Use  . Smoking status: Never Smoker  . Smokeless tobacco: Never Used  Substance and Sexual Activity  . Alcohol use: No  . Drug use: No  . Sexual activity: Yes

## 2020-06-22 ENCOUNTER — Encounter: Payer: Self-pay | Admitting: Family

## 2020-06-22 ENCOUNTER — Ambulatory Visit (INDEPENDENT_AMBULATORY_CARE_PROVIDER_SITE_OTHER): Payer: PRIVATE HEALTH INSURANCE | Admitting: Family

## 2020-06-22 ENCOUNTER — Ambulatory Visit (INDEPENDENT_AMBULATORY_CARE_PROVIDER_SITE_OTHER): Payer: PRIVATE HEALTH INSURANCE

## 2020-06-22 DIAGNOSIS — M84374A Stress fracture, right foot, initial encounter for fracture: Secondary | ICD-10-CM | POA: Diagnosis not present

## 2020-06-22 NOTE — Progress Notes (Signed)
   Office Visit Note   Patient: Earl Medina           Date of Birth: 07/26/1985           MRN: [id number] Visit Date: 06/22/2020              Requested by: Ileana Ladd, MD [address] [place],  [place] [address] PCP: Ileana Ladd, MD  Chief Complaint  Patient presents with  . Right Foot - Fracture      HPI: The patient is a 35 year old gentleman seen today in follow-up for stress fracture of his right second metatarsal he has been in a short cam walker for the last 4 weeks.  States that he has not been having significant pain with ambulation he is also been walking barefoot in the evenings in his home without pain.   Assessment & Plan: Visit Diagnoses:  1. Stress fracture of metatarsal bone of right foot, initial encounter     Plan: He will discontinue his cam walker.  Resume regular shoewear.  Discussed using stiff supportive shoe wear. Follow-Up Instructions: No follow-ups on file.   Ortho Exam  Patient is alert, oriented, no adenopathy, well-dressed, normal affect, normal respiratory effort. On examination of the right foot mild tenderness along the second metatarsal.  Palpable dorsalis pedis pulse.  There is no edema no erythema.  Imaging: No results found. No images are attached to the encounter.  Labs: No results found for: HGBA1C, ESRSEDRATE, CRP, LABURIC, REPTSTATUS, GRAMSTAIN, CULT, LABORGA   Lab Results  Component Value Date   ALBUMIN 4.6 10/10/2014    No results found for: MG No results found for: VD25OH  No results found for: PREALBUMIN CBC EXTENDED Latest Ref Rng & Units 10/10/2014  WBC 4.0 - 10.5 K/uL 4.0  RBC 4.22 - 5.81 MIL/uL 5.82(H)  HGB 13.0 - 17.0 g/dL 62.5  HCT 63.8 - 93.7 % 48.2  PLT 150 - 400 K/uL 310  NEUTROABS 1.7 - 7.7 K/uL 2.2  LYMPHSABS 0.7 - 4.0 K/uL 1.3     There is no height or weight on file to calculate BMI.  Orders:  Orders Placed This Encounter  Procedures  . XR Foot 2 Views Right   No orders of the  defined types were placed in this encounter.    Procedures: No procedures performed  Clinical Data: No additional findings.  ROS:  All other systems negative, except as noted in the HPI. Review of Systems  Constitutional: Negative for chills and fever.  Musculoskeletal: Positive for arthralgias.    Objective: Vital Signs: There were no vitals taken for this visit.  Specialty Comments:  No specialty comments available.  PMFS History: Patient Active Problem List   Diagnosis Date Noted  . Atypical chest pain 04/14/2010   Past Medical History:  Diagnosis Date  . Allergy     History reviewed. No pertinent family history.  History reviewed. No pertinent surgical history. Social History   Occupational History  . Not on file  Tobacco Use  . Smoking status: Never Smoker  . Smokeless tobacco: Never Used  Substance and Sexual Activity  . Alcohol use: No  . Drug use: No  . Sexual activity: Yes

## 2020-09-02 ENCOUNTER — Ambulatory Visit (HOSPITAL_COMMUNITY)
Admission: RE | Admit: 2020-09-02 | Discharge: 2020-09-02 | Disposition: A | Discharge status: Home / Self Care | Payer: PRIVATE HEALTH INSURANCE | Source: Ambulatory Visit | Attending: Emergency Medicine | Admitting: Emergency Medicine

## 2020-09-02 ENCOUNTER — Encounter (HOSPITAL_COMMUNITY): Payer: Self-pay

## 2020-09-02 ENCOUNTER — Other Ambulatory Visit: Payer: Self-pay

## 2020-09-02 VITALS — BP 117/77 | HR 73 | Temp 98.6°F | Resp 15

## 2020-09-02 DIAGNOSIS — Z20822 Contact with and (suspected) exposure to covid-19: Secondary | ICD-10-CM | POA: Diagnosis not present

## 2020-09-02 DIAGNOSIS — J069 Acute upper respiratory infection, unspecified: Secondary | ICD-10-CM | POA: Diagnosis present

## 2020-09-02 NOTE — Discharge Instructions (Addendum)

## 2020-09-02 NOTE — ED Triage Notes (Signed)
Patient c/o sore throat and sinus pressure x 3 days.   Patient denies fever at home.   Patient endorses generalized body aches.   Patient endorses sore throat is scratchy and "feels like something is in there".   Patient performed an at home COVID test with a negative test result per patient report.   Patient has taken Advil cold/sinus, naproxen, and Sudafed with some relief of symptoms.

## 2020-09-02 NOTE — ED Provider Notes (Signed)
[facility]    CSN: [id number] Arrival date & time: 09/02/20  1404      History   Chief Complaint Chief Complaint  Patient presents with   Sore Throat   Facial Pain   APPT 1400    HPI Earl Medina is a 35 y.o. male.   Patient here for evaluation of cough, congestion, ear pain, sinus pressure, fever, and generalized body aches that started on Monday. Reports at home COVID test was negative.  Reports taking OTC medications, including sudafed, with some symptom relief.  Reports symptoms are improved.  Denies any recent sick contacts.  Denies any trauma, injury, or other precipitating event.  Denies any specific alleviating or aggravating factors.  Denies any fevers, chest pain, shortness of breath, N/V/D, numbness, tingling, weakness, abdominal pain, or headaches.     The history is provided by the patient.  Sore Throat Pertinent negatives include no chest pain.   Past Medical History:  Diagnosis Date   Allergy     Patient Active Problem List   Diagnosis Date Noted   Atypical chest pain 04/14/2010    History reviewed. No pertinent surgical history.     Home Medications    Prior to Admission medications   Medication Sig Start Date End Date Taking? Authorizing Provider  cetirizine (ZYRTEC) 10 MG tablet Take 1 tablet by mouth daily.   Yes [provider]  fluticasone (FLONASE SENSIMIST) 27.5 MCG/SPRAY nasal spray 2 sprays each nostril    [provider]    Family History History reviewed. No pertinent family history.  Social History Social History   Tobacco Use   Smoking status: Never   Smokeless tobacco: Never  Substance Use Topics   Alcohol use: No   Drug use: No     Allergies   Penicillins and Amoxicillin   Review of Systems Review of Systems  HENT:  Positive for congestion, ear pain, sinus pressure and sore throat.   Respiratory:  Positive for cough. Negative for chest tightness.   Cardiovascular:  Negative for  chest pain.  All other systems reviewed and are negative.   Physical Exam Triage Vital Signs ED Triage Vitals  Enc Vitals Group     BP 09/02/20 1435 117/77     Pulse Rate 09/02/20 1435 73     Resp 09/02/20 1435 15     Temp 09/02/20 1435 98.6 F (37 C)     Temp Source 09/02/20 1435 Oral     SpO2 09/02/20 1435 98 %     Weight --      Height --      Head Circumference --      Peak Flow --      Pain Score 09/02/20 1433 4     Pain Loc --      Pain Edu? --      Excl. in GC? --    No data found.  Updated Vital Signs BP 117/77 (BP Location: Left Arm)   Pulse 73   Temp 98.6 F (37 C) (Oral)   Resp 15   SpO2 98%   Visual Acuity Right Eye Distance:   Left Eye Distance:   Bilateral Distance:    Right Eye Near:   Left Eye Near:    Bilateral Near:     Physical Exam Vitals and nursing note reviewed.  Constitutional:      General: He is not in acute distress.    Appearance: Normal appearance. He is not ill-appearing, toxic-appearing or diaphoretic.  HENT:     Head: Normocephalic and atraumatic.     Right Ear: Tympanic membrane and ear canal normal.     Left Ear: Tympanic membrane and ear canal normal.     Nose: Congestion present.     Mouth/Throat:     Mouth: Mucous membranes are moist.     Pharynx: Oropharynx is clear. Uvula midline. No pharyngeal swelling, posterior oropharyngeal erythema or uvula swelling.     Tonsils: No tonsillar exudate or tonsillar abscesses. 0 on the right. 0 on the left.  Eyes:     Conjunctiva/sclera: Conjunctivae normal.  Cardiovascular:     Rate and Rhythm: Normal rate and regular rhythm.     Pulses: Normal pulses.     Heart sounds: Normal heart sounds.  Pulmonary:     Effort: Pulmonary effort is normal.     Breath sounds: Normal breath sounds.  Abdominal:     General: Abdomen is flat.  Musculoskeletal:        General: Normal range of motion.     Cervical back: Normal range of motion.  Skin:    General: Skin is warm and dry.   Neurological:     General: No focal deficit present.     Mental Status: He is alert and oriented to person, place, and time.  Psychiatric:        Mood and Affect: Mood normal.     UC Treatments / Results  Labs (all labs ordered are listed, but only abnormal results are displayed) Labs Reviewed  SARS CORONAVIRUS 2 (TAT 6-24 HRS)    EKG   Radiology No results found.  Procedures Procedures (including critical care time)  Medications Ordered in UC Medications - No data to display  Initial Impression / Assessment and Plan / UC Course  I have reviewed the triage vital signs and the nursing notes.  Pertinent labs & imaging results that were available during my care of the patient were reviewed by me and considered in my medical decision making (see chart for details).    Assessment negative for red flags or concerns.  Home test was negative, but can retest as it is possible he tested too soon or test was not completed properly.  COVID test pending.  Discussed need to quarantine while waiting for results and for at least 5 days from symptom onset if the test is positive.  Encouraged fluids and rest.  Discussed conservative symptom management as described in discharge instructions.  Follow up with primary care as needed.  Final Clinical Impressions(s) / UC Diagnoses   Final diagnoses:  Viral URI     Discharge Instructions      We will contact you if your COVID test is positive.  Please quarantine while you wait for the results.  If your test is negative you may resume normal activities.  If your test is positive please continue to quarantine for at least 5 days from your symptom onset or until you are without a fever for at least 24 hours after the medications.  You can take Tylenol and/or Ibuprofen as needed for fever reduction and pain relief.   For cough: honey 1/2 to 1 teaspoon (you can dilute the honey in water or another fluid).  You can also use guaifenesin and  dextromethorphan for cough. You can use a humidifier for chest congestion and cough.  If you don't have a humidifier, you can sit in the bathroom with the hot shower running.     For sore throat: try  warm salt water gargles, cepacol lozenges, throat spray, warm tea or water with lemon/honey, popsicles or ice, or OTC cold relief medicine for throat discomfort.    For congestion: take a daily anti-histamine like Zyrtec, Claritin, and a oral decongestant, such as pseudoephedrine.  You can also use Flonase 1-2 sprays in each nostril daily.    It is important to stay hydrated: drink plenty of fluids (water, gatorade/powerade/pedialyte, juices, or teas) to keep your throat moisturized and help further relieve irritation/discomfort.   Return or go to the Emergency Department if symptoms worsen or do not improve in the next few days.      ED Prescriptions   None    PDMP not reviewed this encounter.   Ivette Loyal, NP 09/02/20 1524

## 2020-09-03 LAB — SARS CORONAVIRUS 2 (TAT 6-24 HRS): SARS Coronavirus 2: NEGATIVE

## 2021-07-18 ENCOUNTER — Encounter (HOSPITAL_COMMUNITY): Payer: Self-pay | Admitting: Emergency Medicine

## 2021-07-18 ENCOUNTER — Ambulatory Visit (HOSPITAL_COMMUNITY)
Admission: EM | Admit: 2021-07-18 | Discharge: 2021-07-18 | Disposition: A | Discharge status: Home / Self Care | Payer: PRIVATE HEALTH INSURANCE | Attending: Internal Medicine | Admitting: Internal Medicine

## 2021-07-18 DIAGNOSIS — J392 Other diseases of pharynx: Secondary | ICD-10-CM

## 2021-07-18 LAB — POCT RAPID STREP A, ED / UC: Streptococcus, Group A Screen (Direct): NEGATIVE

## 2021-07-18 MED ORDER — FLONASE SENSIMIST 27.5 MCG/SPRAY NA SUSP: 1.0000 | Freq: Every day | NASAL | 2 refills | Status: DC

## 2021-07-18 MED ORDER — CETIRIZINE HCL 10 MG PO TABS: 10.0000 mg | ORAL_TABLET | Freq: Every day | ORAL | 2 refills | Status: DC

## 2021-07-18 NOTE — ED Provider Notes (Signed)
[facility]    CSN: [id number] Arrival date & time: 07/18/21  0907     History   Chief Complaint Chief Complaint  Patient presents with   Sore Throat    HPI Earl Medina is a 36 y.o. male.  Presents with 1 week history of "scratchy" throat.  States he feels there is grit in his throat.  He has children at home diagnosed with strep.  Denies pain, no trouble swallowing.  He has been taking daily allergy medicine, also tried TheraFlu. Denies fever, chills, congestion, runny nose, ear pain or pressure, cough, shortness of breath, abdominal pain, vomiting/diarrhea.  Normal appetite.  History of seasonal allergies.  Past Medical History:  Diagnosis Date   Allergy     Patient Active Problem List   Diagnosis Date Noted   Atypical chest pain 04/14/2010    History reviewed. No pertinent surgical history.   Home Medications    Prior to Admission medications   Medication Sig Start Date End Date Taking? Authorizing Provider  cetirizine (ZYRTEC) 10 MG tablet Take 1 tablet (10 mg total) by mouth daily. 07/18/21   Jakeya Gherardi, PA-C  fluticasone (FLONASE SENSIMIST) 27.5 MCG/SPRAY nasal spray Place 1 spray into the nose daily. 07/18/21   Stepheni Cameron, [place]    Family History History reviewed. No pertinent family history.  Social History Social History   Tobacco Use   Smoking status: Never   Smokeless tobacco: Never  Substance Use Topics   Alcohol use: No   Drug use: No     Allergies   Penicillins and Amoxicillin   Review of Systems Review of Systems As per HPI  Physical Exam Triage Vital Signs ED Triage Vitals [07/18/21 1002]  Enc Vitals Group     BP 122/78     Pulse Rate 69     Resp 16     Temp 98.2 F (36.8 C)     Temp Source Oral     SpO2 98 %     Weight      Height      Head Circumference      Peak Flow      Pain Score 1     Pain Loc      Pain Edu?      Excl. in GC?    No data found.  Updated Vital Signs BP 122/78 (BP  Location: Left Arm)   Pulse 69   Temp 98.2 F (36.8 C) (Oral)   Resp 16   SpO2 98%   Visual Acuity Right Eye Distance:   Left Eye Distance:   Bilateral Distance:    Right Eye Near:   Left Eye Near:    Bilateral Near:     Physical Exam Vitals and nursing note reviewed.  Constitutional:      General: He is not in acute distress. HENT:     Right Ear: Tympanic membrane and ear canal normal.     Left Ear: Tympanic membrane and ear canal normal.     Nose: No congestion or rhinorrhea.     Mouth/Throat:     Mouth: Mucous membranes are moist.     Pharynx: Oropharynx is clear. Uvula midline. No oropharyngeal exudate or posterior oropharyngeal erythema.     Tonsils: No tonsillar exudate or tonsillar abscesses.  Eyes:     Conjunctiva/sclera: Conjunctivae normal.     Pupils: Pupils are equal, round, and reactive to light.  Cardiovascular:     Rate and Rhythm: Normal rate and regular rhythm.  Heart sounds: Normal heart sounds.  Pulmonary:     Effort: Pulmonary effort is normal. No respiratory distress.     Breath sounds: Normal breath sounds. No wheezing.  Abdominal:     General: Bowel sounds are normal.     Palpations: Abdomen is soft.     Tenderness: There is no abdominal tenderness.  Musculoskeletal:     Cervical back: Normal range of motion.  Lymphadenopathy:     Cervical: No cervical adenopathy.  Neurological:     Mental Status: He is alert and oriented to person, place, and time.     UC Treatments / Results  Labs (all labs ordered are listed, but only abnormal results are displayed) Labs Reviewed  POCT RAPID STREP A, ED / UC    EKG  Radiology No results found.  Procedures Procedures (including critical care time)  Medications Ordered in UC Medications - No data to display  Initial Impression / Assessment and Plan / UC Course  I have reviewed the triage vital signs and the nursing notes.  Pertinent labs & imaging results that were available during my  care of the patient were reviewed by me and considered in my medical decision making (see chart for details).   Strep test in clinic today negative.  Most likely cause of throat irritation is allergies vs viral.  Discussed with patient to continue daily allergy medicine.  I also recommend using a dehumidifier at nighttime.  Can try warm salt water gargle or throat lozenges for irritation.  He can also try tea and honey.  Discussed with patient that at home symptomatic care is the best treatment for his symptoms.  Discussed return precautions.  Patient agrees to plan and is discharged in stable condition  Final Clinical Impressions(s) / UC Diagnoses   Final diagnoses:  Throat irritation     Discharge Instructions      Please continue symptomatic care at home.  Please return to the urgent care or emergency department if symptoms worsen or do not improve.    ED Prescriptions     Medication Sig Dispense Auth. Provider   cetirizine (ZYRTEC) 10 MG tablet Take 1 tablet (10 mg total) by mouth daily. 30 tablet Faiza Bansal, PA-C   fluticasone (FLONASE SENSIMIST) 27.5 MCG/SPRAY nasal spray Place 1 spray into the nose daily. 10 g Heyli Min, Lurena Joiner, PA-C      PDMP not reviewed this encounter.   Gloriann Riede, Lurena Joiner, [place] 07/18/21 1103

## 2021-07-18 NOTE — Discharge Instructions (Signed)
Please continue symptomatic care at home.  Please return to the urgent care or emergency department if symptoms worsen or do not improve.

## 2021-07-18 NOTE — ED Triage Notes (Signed)
Sore throat x 1 week. Children dx w strep currently

## 2021-08-30 IMAGING — DX DG FOOT COMPLETE 3+V*R*
3 series · 3 of 3 positions shown · non-contrast
Comparison: None.

CLINICAL DATA: Foot pain for 1 month, no known injury

EXAM:
RIGHT FOOT COMPLETE - 3+ VIEW

[foot ap]
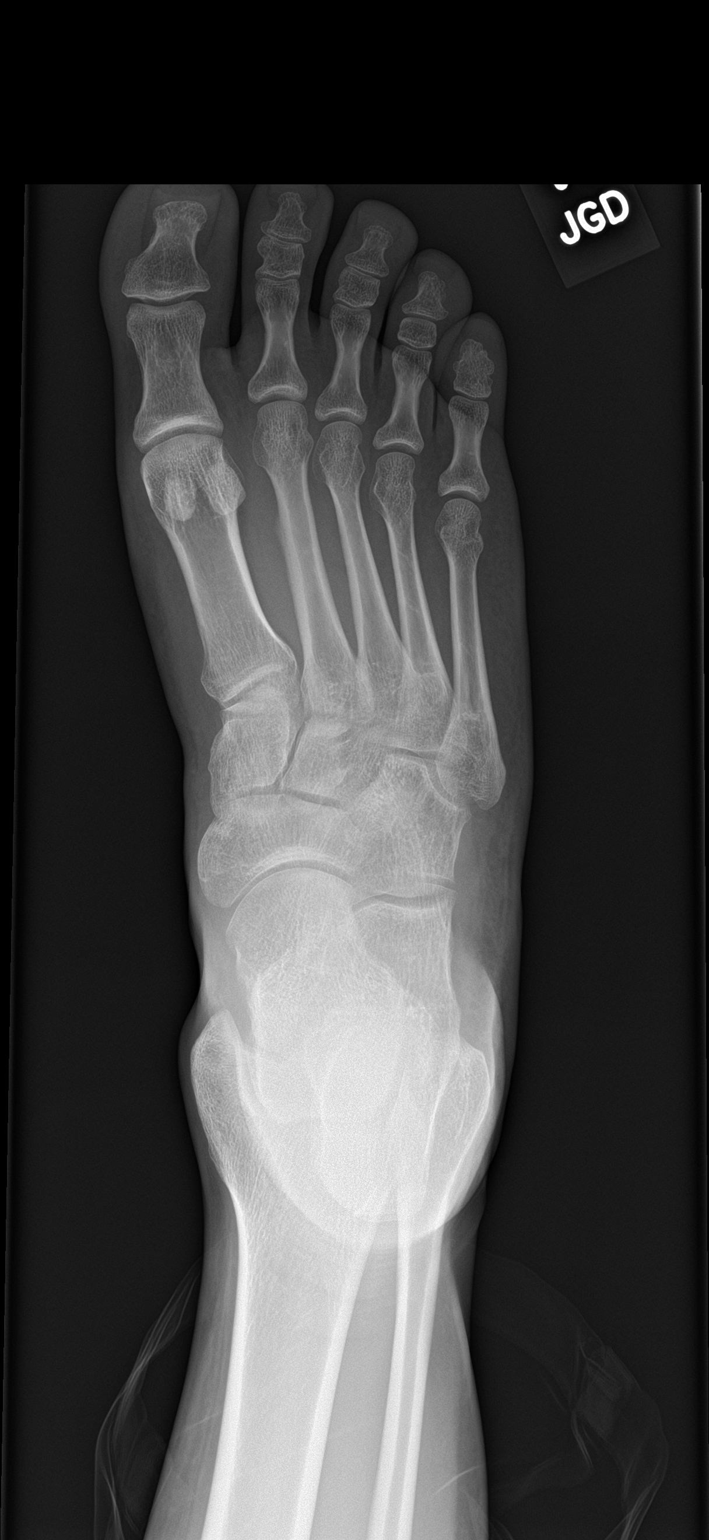

[foot obl]
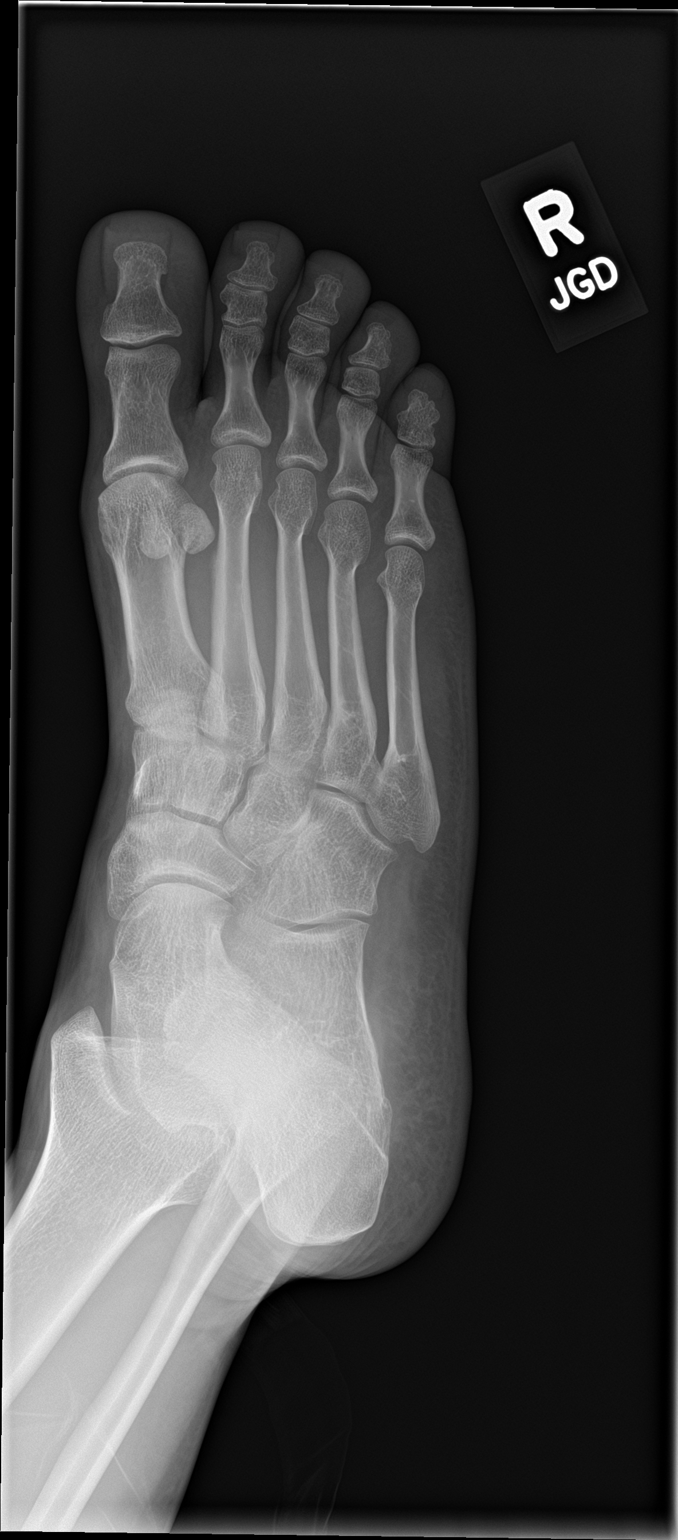

[foot lat]
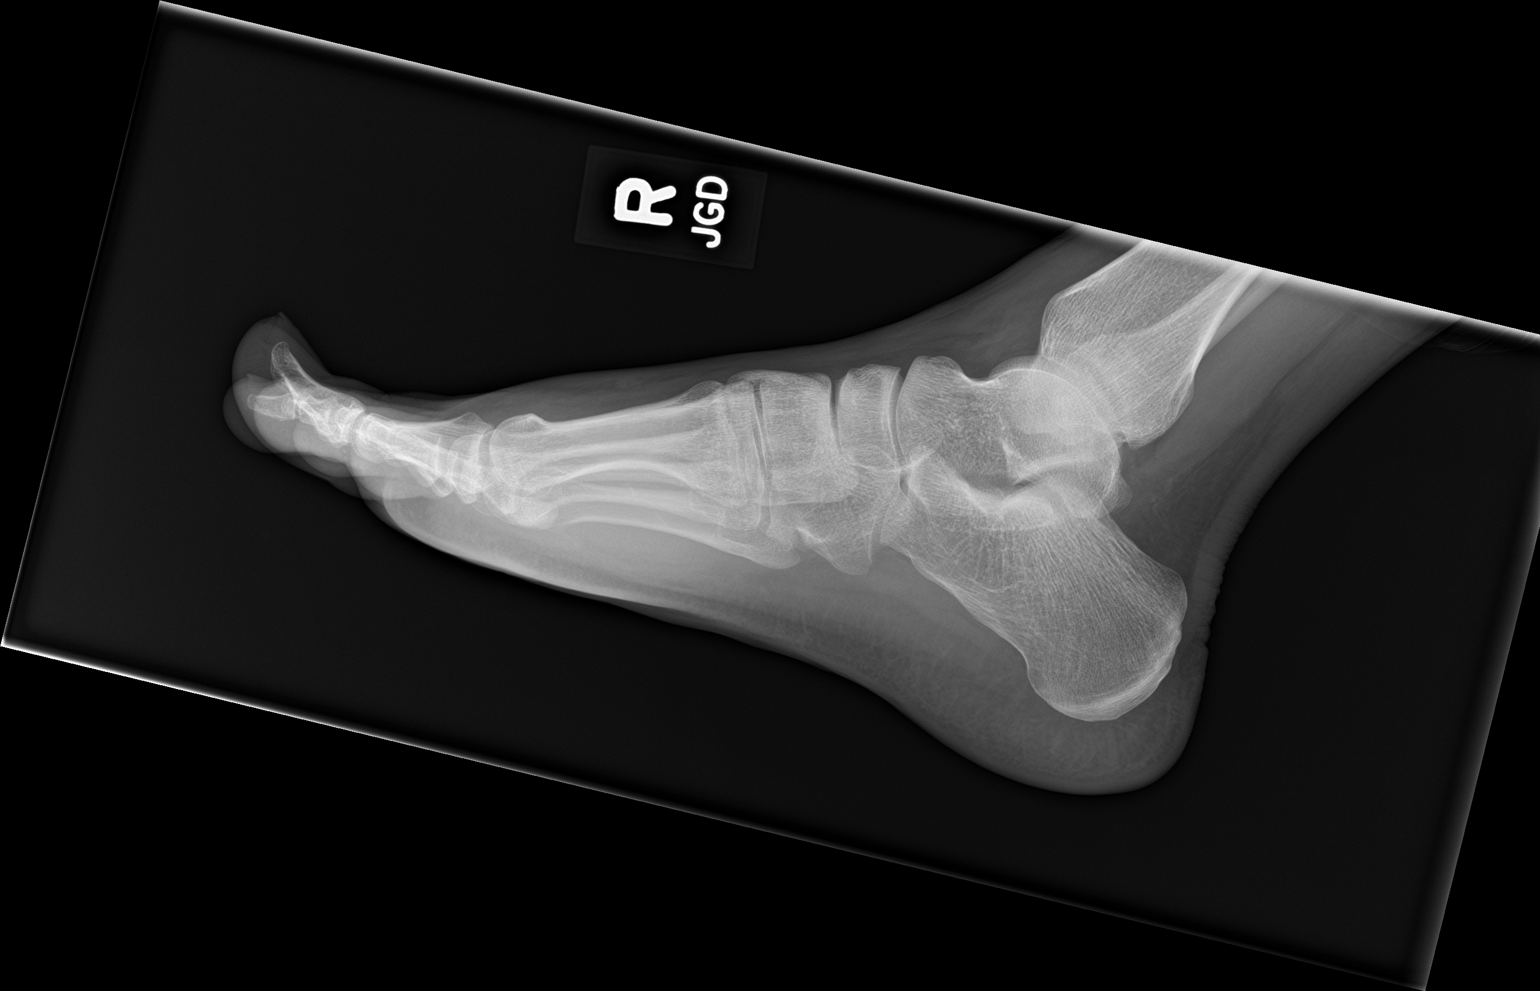

[3 of 3 positions shown; findings below may reference images not displayed]

FINDINGS: Periosteal thickening and elevation along the medial aspect of the
second metatarsal with thin adjacent transverse lucency suspicious
for a stress fracture type injury. No other acute fracture or
significant malalignment significant within the limitations of a
nonweightbearing exam. Normal bone mineralization. No worrisome
osseous lesions. Congenital fusion of the fifth middle and distal
phalanx.
IMPRESSION: Periosteal thickening and elevation along the medial aspect of the
second metatarsal with thin adjacent transverse lucency suspicious
for a stress fracture.

## 2023-03-14 ENCOUNTER — Encounter (HOSPITAL_COMMUNITY): Payer: Self-pay

## 2023-03-14 ENCOUNTER — Ambulatory Visit (HOSPITAL_COMMUNITY)
Admission: RE | Admit: 2023-03-14 | Discharge: 2023-03-14 | Disposition: A | Discharge status: Home / Self Care | Payer: Commercial Managed Care - PPO | Source: Ambulatory Visit | Attending: Family Medicine | Admitting: Family Medicine

## 2023-03-14 ENCOUNTER — Ambulatory Visit (INDEPENDENT_AMBULATORY_CARE_PROVIDER_SITE_OTHER): Payer: Commercial Managed Care - PPO

## 2023-03-14 VITALS — BP 142/81 | HR 101 | Temp 99.1°F | Resp 18

## 2023-03-14 DIAGNOSIS — J0110 Acute frontal sinusitis, unspecified: Secondary | ICD-10-CM | POA: Diagnosis not present

## 2023-03-14 DIAGNOSIS — R051 Acute cough: Secondary | ICD-10-CM

## 2023-03-14 DIAGNOSIS — R110 Nausea: Secondary | ICD-10-CM | POA: Diagnosis not present

## 2023-03-14 MED ORDER — PROMETHAZINE-DM 6.25-15 MG/5ML PO SYRP: 5.0000 mL | ORAL_SOLUTION | Freq: Four times a day (QID) | ORAL | 0 refills | Status: AC | PRN

## 2023-03-14 MED ORDER — SULFAMETHOXAZOLE-TRIMETHOPRIM 800-160 MG PO TABS: 1.0000 | ORAL_TABLET | Freq: Two times a day (BID) | ORAL | 0 refills | Status: AC

## 2023-03-14 MED ORDER — ONDANSETRON 4 MG PO TBDP: 4.0000 mg | ORAL_TABLET | Freq: Three times a day (TID) | ORAL | 0 refills | Status: AC | PRN

## 2023-03-14 NOTE — ED Triage Notes (Signed)
Patient presents with nasal congestion, sore throat, non productive cough, body aches and chills x 2 weeks. Patient states he has tried Tylenol and Mucinex D without relief.

## 2023-03-15 NOTE — ED Provider Notes (Signed)
[facility]   [id number] 03/14/23 Arrival Time: 1724  ASSESSMENT & PLAN:  1. Acute cough   2. Acute non-recurrent frontal sinusitis   3. Nausea without vomiting    I have personally viewed and independently interpreted the imaging studies ordered this visit. CXR: no acute changes.  Meds ordered this encounter  Medications   promethazine-dextromethorphan (PROMETHAZINE-DM) 6.25-15 MG/5ML syrup    Sig: Take 5 mLs by mouth 4 (four) times daily as needed for cough.    Dispense:  118 mL    Refill:  0   ondansetron (ZOFRAN-ODT) 4 MG disintegrating tablet    Sig: Take 1 tablet (4 mg total) by mouth every 8 (eight) hours as needed for nausea or vomiting.    Dispense:  15 tablet    Refill:  0   sulfamethoxazole-trimethoprim (BACTRIM DS) 800-160 MG tablet    Sig: Take 1 tablet by mouth 2 (two) times daily.    Dispense:  14 tablet    Refill:  0   Discussed typical duration of symptoms. OTC symptom care as needed. Ensure adequate fluid intake and rest.   Follow-up Information     Ileana Ladd, MD.   Specialty: Family Medicine Why: As needed. Contact information: [address] [place] [address] [phone]                 Reviewed expectations re: course of current medical issues. Questions answered. Outlined signs and symptoms indicating need for more acute intervention. Patient verbalized understanding. After Visit Summary given.   SUBJECTIVE: History from: patient.  Earl Medina is a 38 y.o. male who presents with complaint of nasal congestion, post-nasal drainage, and sinus pain. Onset gradual,  2 weeks ago . Respiratory symptoms: does report a non-prod cough; worse at night. Fever: denies. Overall normal PO intake without n/v. Tylenol and Mucinex without much relief.  Social History   Tobacco Use  Smoking Status Never  Smokeless Tobacco Never   Also mentions mild nausea without emesis; ques from post-nasal  drainage.  OBJECTIVE:  Vitals:   03/14/23 1750  BP: (!) 142/81  Pulse: (!) 101  Resp: 18  Temp: 99.1 F (37.3 C)  TempSrc: Oral  SpO2: 98%    Recheck P: 94 General appearance: alert; no distress HEENT: nasal congestion; clear runny nose; throat irritation secondary to post-nasal drainage; frontal tenderness to palpation; turbinates boggy Neck: supple without LAD; trachea midline Lungs: unlabored respirations, symmetrical air entry; cough: mild; no respiratory distress Skin: warm and dry Psychological: alert and cooperative; normal mood and affect  DG Chest 2 View Result Date: 03/14/2023 CLINICAL DATA:  Fever, cough and congestion. EXAM: CHEST - 2 VIEW COMPARISON:  None Available. FINDINGS: The heart size and mediastinal contours are within normal limits. Both lungs are clear. The visualized skeletal structures are unremarkable. IMPRESSION: No active cardiopulmonary disease. Electronically Signed   By: Elgie Collard M.D.   On: 03/14/2023 19:21    Allergies  Allergen Reactions   Peanut (Diagnostic) Other (See Comments)   Penicillins    Sertraline Hcl     Other Reaction(s): more depressed   Amoxicillin Rash    Past Medical History:  Diagnosis Date   Allergy    No family history on file. Social History   Socioeconomic History   Marital status: Married    Spouse name: Not on file   Number of children: Not on file   Years of education: Not on file   Highest education level: Not on file  Occupational History   Not on file  Tobacco Use   Smoking status: Never   Smokeless tobacco: Never  Substance and Sexual Activity   Alcohol use: No   Drug use: No   Sexual activity: Yes  Other Topics Concern   Not on file  Social History Narrative   Not on file   Social Drivers of Health   Financial Resource Strain: Not on file  Food Insecurity: Not on file  Transportation Needs: Not on file  Physical Activity: Not on file  Stress: Not on file  Social Connections: Not  on file  Intimate Partner Violence: Not on file             Mardella Layman, MD 03/15/23 [phone]

## 2023-09-27 ENCOUNTER — Ambulatory Visit: Admitting: Orthopedic Surgery

## 2023-09-27 ENCOUNTER — Other Ambulatory Visit (INDEPENDENT_AMBULATORY_CARE_PROVIDER_SITE_OTHER): Payer: Self-pay

## 2023-09-27 DIAGNOSIS — M25522 Pain in left elbow: Secondary | ICD-10-CM

## 2023-10-03 ENCOUNTER — Encounter: Payer: Self-pay | Admitting: Orthopedic Surgery

## 2023-10-03 NOTE — Progress Notes (Signed)
   Office Visit Note   Patient: Earl Medina           Date of Birth: [birth year]           MRN: [id number] Visit Date: 09/27/2023              Requested by: No referring provider defined for this encounter. PCP: Cyrena Gwenn SQUIBB, MD (Inactive)  Chief Complaint  Patient presents with   Left Elbow - Pain      HPI: Patient is a 38 year old gentleman who presents with a 93-month history of left elbow pain.  Patient states the pain started after lifting at the gym.  He took a break for several weeks and got better.  Patient states his pain is currently a 2 or 3 out of 10.  Pain radiates up and down the posterior aspect of the elbow.  Assessment & Plan: Visit Diagnoses:  1. Pain in left elbow     Plan: Patient was given instructions and demonstrated isometric loading for the triceps.  Recommended Voltaren gel.  Follow-Up Instructions: Return if symptoms worsen or fail to improve.   Ortho Exam  Patient is alert, oriented, no adenopathy, well-dressed, normal affect, normal respiratory effort. Examination patient's left upper extremity is neurovasc intact there is no pain with supination or pronation the radial head is not tender to palpation.  He is point tender to palpation at the insertion of the triceps.  There is no palpable defect.  The tendon is intact.  There is good extensor strength.  Radiographs show a small area of calcification at the triceps insertion.    Imaging: No results found. No images are attached to the encounter.  Labs: No results found for: HGBA1C, ESRSEDRATE, CRP, LABURIC, REPTSTATUS, GRAMSTAIN, CULT, LABORGA   Lab Results  Component Value Date   ALBUMIN 4.6 10/10/2014    No results found for: MG No results found for: VD25OH  No results found for: PREALBUMIN    Latest Ref Rng & Units 10/10/2014   10:03 AM  CBC EXTENDED  WBC 4.0 - 10.5 K/uL 4.0   RBC 4.22 - 5.81 MIL/uL 5.82   Hemoglobin 13.0 - 17.0 g/dL 83.0   HCT 60.9  - 47.9 % 48.2   Platelets 150 - 400 K/uL 310   NEUT# 1.7 - 7.7 K/uL 2.2   Lymph# 0.7 - 4.0 K/uL 1.3      There is no height or weight on file to calculate BMI.  Orders:  Orders Placed This Encounter  Procedures   XR Elbow 2 Views Left   No orders of the defined types were placed in this encounter.    Procedures: No procedures performed  Clinical Data: No additional findings.  ROS:  All other systems negative, except as noted in the HPI. Review of Systems  Objective: Vital Signs: There were no vitals taken for this visit.  Specialty Comments:  No specialty comments available.  PMFS History: Patient Active Problem List   Diagnosis Date Noted   Atypical chest pain 04/14/2010   Past Medical History:  Diagnosis Date   Allergy     History reviewed. No pertinent family history.  History reviewed. No pertinent surgical history. Social History   Occupational History   Not on file  Tobacco Use   Smoking status: Never   Smokeless tobacco: Never  Substance and Sexual Activity   Alcohol use: No   Drug use: No   Sexual activity: Yes

## 2023-12-24 ENCOUNTER — Encounter: Payer: Self-pay | Admitting: Radiology
